# Patient Record
Sex: Female | Born: 2000
Health system: Southern US, Community
[De-identification: ages and names within clinical notes are randomized; demographics above are authoritative.]

## PROBLEM LIST (undated history)

## (undated) DIAGNOSIS — E669 Obesity, unspecified: Principal | ICD-10-CM

## (undated) HISTORY — DX: Obesity, unspecified: E66.9

## (undated) HISTORY — PX: WISDOM TOOTH EXTRACTION: SHX21

---
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
## 2018-01-27 ENCOUNTER — Encounter: Payer: Self-pay | Admitting: Family Medicine

## 2018-01-27 ENCOUNTER — Ambulatory Visit (INDEPENDENT_AMBULATORY_CARE_PROVIDER_SITE_OTHER): Payer: BLUE CROSS/BLUE SHIELD | Admitting: Family Medicine

## 2018-01-27 VITALS — BP 125/82 | HR 81 | Temp 98.4°F | Ht 64.0 in | Wt 173.0 lb

## 2018-01-27 DIAGNOSIS — Z23 Encounter for immunization: Secondary | ICD-10-CM | POA: Diagnosis not present

## 2018-01-27 DIAGNOSIS — Z862 Personal history of diseases of the blood and blood-forming organs and certain disorders involving the immune mechanism: Secondary | ICD-10-CM | POA: Diagnosis not present

## 2018-01-27 DIAGNOSIS — Z723 Lack of physical exercise: Secondary | ICD-10-CM

## 2018-01-27 DIAGNOSIS — G4719 Other hypersomnia: Secondary | ICD-10-CM

## 2018-01-27 NOTE — Patient Instructions (Addendum)
We will be obtaining a CBC, TSH, free T3 and free T4 as well as vitamin D, CMP, A1c and a full anemia panel with your history of iron deficiency anemia.  -We also have referred you to sleep medicine and if you have not heard from them in approximately 1 week, please call and speak with Joselyn Glassmanyler.  I would like to see you in about 1 month to see how you are doing with things.  However, we will call you sooner to review the lab results with you.  If you have my chart that is how we will communicate with you.   Hypersomnia Hypersomnia is when you feel extremely tired during the day even though you're getting plenty of sleep at night. You may need to take naps during the day, and you may also be extremely difficult to wake up when you are sleeping. What are the causes? The cause of your hypersomnia may not be known. Hypersomnia may be caused by:  Medicines.  Sleep disorders, such as narcolepsy.  Trauma or injury to your head or nervous system.  Using drugs or alcohol.  Tumors.  Medical conditions, such as depression or hypothyroidism.  Genetics.  What are the signs or symptoms? The main symptoms of hypersomnia include:  Feeling extremely tired throughout the day.  Being very difficult to wake up.  Sleeping for longer and longer periods.  Taking naps throughout the day.  Other symptoms may include:  Feeling: ? Restless. ? Annoyed. ? Anxious. ? Low energy.  Having difficulty: ? Remembering. ? Speaking. ? Thinking.  Losing your appetite.  Experiencing hallucinations.  How is this diagnosed? Hypersomnia may be diagnosed by:  Medical history and physical exam. This will include a sleep history.  Completing sleep logs.  Tests may also be done, such as: ? Polysomnography. ? Multiple sleep latency test (MSLT).  How is this treated? There is no cure for hypersomnia, but treatment can be very effective in helping manage the condition. Treatment may  include:  Lifestyle and sleeping strategies to help cope with the condition.  Stimulant medicines.  Treating any underlying causes of hypersomnia.  Follow these instructions at home:  Take medicines only as directed by your health care provider.  Schedule short naps for when you feel sleepiest during the day. Tell your employer or teachers that you have hypersomnia. You may be able to adjust your schedule to include time for naps.  Avoid drinking alcohol or caffeinated beverages.  Do not eat a heavy meal before bedtime. Eat at about the same times every day.  Do not drive or operate heavy machinery if you are sleepy.  Do not swim or go out on the water without a life jacket.  If possible, adjust your schedule so that you do not have to work or be active at night.  Keep all follow-up visits as directed by your health care provider. This is important. Contact a health care provider if:  You have new symptoms.  Your symptoms get worse. Get help right away if: You have serious thoughts of hurting yourself or someone else. This information is not intended to replace advice given to you by your health care provider. Make sure you discuss any questions you have with your health care provider. Document Released: 02/08/2002 Document Revised: 07/27/2015 Document Reviewed: 09/23/2013 Elsevier Interactive Patient Education  [employer]2018 [employer]

## 2018-01-27 NOTE — Progress Notes (Signed)
Impression and Recommendations:    1. Excessive daytime sleepiness   2. Hx of iron deficiency anemia   3. Inactivity   4. Need for influenza vaccination     1. Excessive Daytime Sleepiness - Referral to sleep study sent today. - Her total score of Epworth Sleepiness Scale is a 16 which is very high risk for OSA. -After discussion with patient, she prefers to go and speak with sleep medicine physician to determine next best steps and what her options are. (I.e. home sleep study etc.) - Lab work drawn today to rule out anemia, hypothyroidism, vitamin D deficiency, electrolyte imbalance etc. as a possible etiology for her fatigue and excessive daytime somnolence. -Notified patient we will contact her via my chart with these results. -   2. History of Iron Deficiency Anemia - No use of supplements since 6 months at least since upon repeat labs from [facility] they were normal and she was told she could discontinue them - Per pt, reported limited symptomatic improvement while on iron.   3. BMI Counseling Explained to patient what BMI refers to, and what it means medically.    Told patient to think about it as a "medical risk stratification measurement" and how increasing BMI is associated with increasing risk/ or worsening state of various diseases such as hypertension, hyperlipidemia, diabetes, premature OA, depression etc.  American Heart Association guidelines for healthy diet, basically Mediterranean diet, and exercise guidelines of 30 minutes 5 days per week or more discussed in detail.  Health counseling performed.  All questions answered.  4. Lifestyle & Preventative Health Maintenance - Advised patient to continue working toward exercising to improve overall mental, physical, and emotional health.    - Reviewed the "spokes of the wheel" of mood and health management.  Stressed the importance of ongoing prudent habits, including regular exercise, appropriate sleep  hygiene, healthful dietary habits, and prayer/meditation to relax.  - Encouraged patient to engage in daily physical activity, especially a formal exercise routine.  Recommended that the patient eventually strive for at least 150 minutes of moderate cardiovascular activity per week according to guidelines established by the [facility].   - Healthy dietary habits encouraged, including low-carb, and high amounts of lean protein in diet.   - Patient should also consume adequate amounts of water.  5. Follow-Up - Need for lab work today. - Discussed that we need to rule out anemia or thyroid, or sleep apnea. - Re-check fasting lab work as recommended. - Otherwise, continue to return for CPE and chronic follow-up as scheduled.   - Patient knows to call in sooner if desired to address acute concerns.    Orders Placed This Encounter  Procedures  . Flu Vaccine QUAD 6+ mos PF IM (Fluarix Quad PF)  . CBC with Differential/Platelet  . TSH  . T3, free  . T4, free  . VITAMIN D 25 Hydroxy (Vit-D Deficiency, Fractures)  . Comprehensive metabolic panel  . Hemoglobin A1c  . Iron, TIBC and Ferritin Panel  . Ambulatory referral to Sleep Studies     Medications Discontinued During This Encounter  Medication Reason  . Cholecalciferol (VITAMIN D3) 2000 units capsule Completed Course  . fexofenadine-pseudoephedrine (ALLEGRA-D ALLERGY & CONGESTION) 180-240 MG 24 hr tablet   . fluticasone (FLONASE) 50 MCG/ACT nasal spray   . Multiple Vitamin (MULTIVITAMIN) tablet   . phentermine (ADIPEX-P) 37.5 MG tablet      Gross side effects, risk and benefits, and alternatives of medications and  treatment plan in general discussed with patient.  Patient is aware that all medications have potential side effects and we are unable to predict every side effect or drug-drug interaction that may occur.   Patient will call with any questions prior to using medication if they have concerns.    Expresses verbal understanding  and consents to current therapy and treatment regimen.  No barriers to understanding were identified.  Red flag symptoms and signs discussed in detail.  Patient expressed understanding regarding what to do in case of emergency\urgent symptoms  Please see AVS handed out to patient at the end of our visit for further patient instructions/ counseling done pertaining to today's office visit.   Return for 4 to 5 weeks for follow-up on hypersomnolence..     Note:  This note was prepared with assistance of Dragon voice recognition software. Occasional wrong-word or sound-a-like substitutions may have occurred due to the inherent limitations of voice recognition software.   This document serves as a record of services personally performed by Thomasene Loteborah Aaronjames Kelsay, DO. It was created on her behalf by Peggye FothergillKatherine Galloway, a trained medical scribe. The creation of this record is based on the scribe's personal observations and the provider's statements to them.   I have reviewed the above medical documentation for accuracy and completeness and I concur.  Thomasene Loteborah Nieshia Larmon, DO 01/27/2018 6:49 PM       --------------------------------------------------------------------------------------------    Subjective:     HPI: Alm BustardHeather Kendle is a 17 y.o. female who [facility] today for issues as discussed below.  Notes she's been fine, just going to school.  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     Patient is not currently sexually active-no plans to become in the near future   Sleep Habits States that she's "tired, all the time."  She gets up at 6:30 AM for football practice.  Is in bed by 10:30 PM at night, and usually falls sleep quickly thereafter, "11 PM at the latest."  In high school, she was a bit more of an insomniac, but was functioning off  of about 7 hours of sleep at high school.  Reports that she fell asleep in class sometimes.  Last year, her roommate started going to bed super early, so the patient started sleeping early as well.  Now she feels "if I don't sleep in until 9:30 AM, I'm gonna fall asleep in class."  Denies cold intolerance, denies feeling freezing cold.  Denies losing her hair.  Does not feel like her skin is really pale or dry.  Falls asleep in class, sometimes while watching a movie, sometimes she'll get tired while driving for long distances.  "I don't feel like it's just random."  Applying to nursing school right now; under "normal stress for a college student."  The new fatigue and drowsiness "happened a little last year."  They drew labs last year through Student Health.  She had to take an iron supplement back then, and notes that after this supplementation, "she was fine."  Had her labs re-checked in about August.  Notes "didn't notice a difference on supplements."  She feels she doesn't eat terrible.  "I have a meal plan, so I usually eat a meat and a vegetable."  Her roommate has said that she snores sometimes.  Denies heart  palpitations, skipped beats, coughing, SOB.    Wt Readings from Last 3 Encounters:  01/27/18 173 lb (78.5 kg) (93 %, Z= 1.45)*  04/09/16 159 lb 1.6 oz (72.2 kg) (89 %, Z= 1.24)*  03/11/16 162 lb (73.5 kg) (91 %, Z= 1.32)*   * Growth percentiles are based on CDC (Girls, 2-20 Years) data.   BP Readings from Last 3 Encounters:  01/27/18 125/82  03/11/16 125/85  02/15/16 128/81 (95 %, Z = 1.61 /  95 %, Z = 1.60)*   *BP percentiles are based on the August 2017 AAP Clinical Practice Guideline for girls   Pulse Readings from Last 3 Encounters:  01/27/18 81  03/11/16 96  02/15/16 96   BMI Readings from Last 3 Encounters:  01/27/18 29.70 kg/m (93 %, Z= 1.46)*  04/09/16 27.10 kg/m (90 %, Z= 1.26)*  03/11/16 27.59 kg/m (91 %, Z= 1.33)*   * Growth percentiles are based  on CDC (Girls, 2-20 Years) data.     Patient Care Team    Relationship Specialty Notifications Start End  Thomasene Lot, DO PCP - General Family Medicine  08/22/15      Patient Active Problem List   Diagnosis Date Noted  . Stress headaches 11/24/2015    Priority: High  . Overweight (BMI 25.0-29.9) 10/26/2015    Priority: High  . Dysmenorrhea(painful periods) 08/30/2015    Priority: Medium  . Menorrhagia with regular cycle 08/30/2015    Priority: Medium  . mild sx of Fatigue 08/30/2015    Priority: Medium  . Environmental and seasonal allergies 11/24/2015    Priority: Low  . Excessive daytime sleepiness 01/27/2018  . Hx of iron deficiency anemia 01/27/2018  . Inactivity 10/05/2015  . Health education/counseling 09/23/2015    Past Medical history, Surgical history, Family history, Social history, Allergies and Medications have been entered into the medical record, reviewed and changed as needed.    Current Meds  Medication Sig  . MIBELAS 24 FE 1-20 MG-MCG(24) CHEW Chew 1 tablet by mouth daily.    Allergies:  No Known Allergies   Review of Systems:  A fourteen system review of systems was performed and found to be positive as per HPI.   Objective:   Blood pressure 125/82, pulse 81, temperature 98.4 F (36.9 C), height 5\' 4"  (1.626 m), weight 173 lb (78.5 kg), SpO2 100 %. Body mass index is 29.7 kg/m. General:  Well Developed, well nourished, appropriate for stated age.  Neuro:  Alert and oriented,  extra-ocular muscles intact  HEENT:  Normocephalic, atraumatic, neck supple, no carotid bruits appreciated  Skin:  no gross rash, warm, pink. Cardiac:  RRR, S1 S2 Respiratory:  ECTA B/L and A/P, Not using accessory muscles, speaking in full sentences- unlabored. Vascular:  Ext warm, no cyanosis apprec.; cap RF less 2 sec. Psych:  No HI/SI, judgement and insight good, Euthymic mood. Full Affect.

## 2018-01-28 LAB — CBC WITH DIFFERENTIAL/PLATELET
BASOS: 0 %
Basophils Absolute: 0 10*3/uL (ref 0.0–0.2)
EOS (ABSOLUTE): 0.1 10*3/uL (ref 0.0–0.4)
Eos: 1 %
Hematocrit: 40.5 % (ref 34.0–46.6)
Hemoglobin: 13.7 g/dL (ref 11.1–15.9)
IMMATURE GRANS (ABS): 0 10*3/uL (ref 0.0–0.1)
IMMATURE GRANULOCYTES: 0 %
LYMPHS: 25 %
Lymphocytes Absolute: 1.9 10*3/uL (ref 0.7–3.1)
MCH: 29 pg (ref 26.6–33.0)
MCHC: 33.8 g/dL (ref 31.5–35.7)
MCV: 86 fL (ref 79–97)
MONOS ABS: 0.5 10*3/uL (ref 0.1–0.9)
Monocytes: 7 %
NEUTROS PCT: 67 %
Neutrophils Absolute: 5.1 10*3/uL (ref 1.4–7.0)
PLATELETS: 280 10*3/uL (ref 150–450)
RBC: 4.72 x10E6/uL (ref 3.77–5.28)
RDW: 12.3 % (ref 12.3–15.4)
WBC: 7.7 10*3/uL (ref 3.4–10.8)

## 2018-01-28 LAB — COMPREHENSIVE METABOLIC PANEL
A/G RATIO: 1.4 (ref 1.2–2.2)
ALK PHOS: 79 IU/L (ref 39–117)
ALT: 14 IU/L (ref 0–32)
AST: 15 IU/L (ref 0–40)
Albumin: 4.2 g/dL (ref 3.5–5.5)
BILIRUBIN TOTAL: 0.3 mg/dL (ref 0.0–1.2)
BUN/Creatinine Ratio: 14 (ref 9–23)
BUN: 10 mg/dL (ref 6–20)
CALCIUM: 9 mg/dL (ref 8.7–10.2)
CHLORIDE: 102 mmol/L (ref 96–106)
CO2: 20 mmol/L (ref 20–29)
Creatinine, Ser: 0.73 mg/dL (ref 0.57–1.00)
GFR calc Af Amer: 138 mL/min/{1.73_m2} (ref >59)
GFR calc non Af Amer: 120 mL/min/{1.73_m2} (ref >59)
Globulin, Total: 2.9 g/dL (ref 1.5–4.5)
Glucose: 127 mg/dL — ABNORMAL HIGH (ref 65–99)
POTASSIUM: 3.9 mmol/L (ref 3.5–5.2)
Sodium: 138 mmol/L (ref 134–144)
Total Protein: 7.1 g/dL (ref 6.0–8.5)

## 2018-01-28 LAB — T4, FREE: FREE T4: 1.21 ng/dL (ref 0.93–1.60)

## 2018-01-28 LAB — IRON,TIBC AND FERRITIN PANEL
Ferritin: 68 ng/mL (ref 15–77)
Iron Saturation: 27 % (ref 15–55)
Iron: 77 ug/dL (ref 27–159)
TIBC: 283 ug/dL (ref 250–450)
UIBC: 206 ug/dL (ref 131–425)

## 2018-01-28 LAB — SPECIMEN STATUS REPORT

## 2018-01-28 LAB — VITAMIN D 25 HYDROXY (VIT D DEFICIENCY, FRACTURES): Vit D, 25-Hydroxy: 32 ng/mL (ref 30.0–100.0)

## 2018-01-28 LAB — T3, FREE: T3 FREE: 3.1 pg/mL (ref 2.3–5.0)

## 2018-01-28 LAB — HEMOGLOBIN A1C
Est. average glucose Bld gHb Est-mCnc: 105 mg/dL
HEMOGLOBIN A1C: 5.3 % (ref 4.8–5.6)

## 2018-01-28 LAB — TSH: TSH: 1.83 u[IU]/mL (ref 0.450–4.500)

## 2018-02-09 ENCOUNTER — Encounter: Payer: Self-pay | Admitting: Cardiovascular Disease

## 2018-02-09 ENCOUNTER — Ambulatory Visit: Payer: BLUE CROSS/BLUE SHIELD | Admitting: Cardiovascular Disease

## 2018-02-09 VITALS — BP 120/84 | HR 75 | Ht 64.0 in | Wt 171.6 lb

## 2018-02-09 DIAGNOSIS — Z862 Personal history of diseases of the blood and blood-forming organs and certain disorders involving the immune mechanism: Secondary | ICD-10-CM | POA: Diagnosis not present

## 2018-02-09 DIAGNOSIS — G4719 Other hypersomnia: Secondary | ICD-10-CM | POA: Diagnosis not present

## 2018-02-09 DIAGNOSIS — E663 Overweight: Secondary | ICD-10-CM

## 2018-02-09 NOTE — Patient Instructions (Signed)
Testing/Procedures: Your physician has recommended that you have a sleep study. This test records several body functions during sleep, including: brain activity, eye movement, oxygen and carbon dioxide blood levels, heart rate and rhythm, breathing rate and rhythm, the flow of air through your mouth and nose, snoring, body muscle movements, and chest and belly movement.  Follow-Up: At [facility], you and your health needs are our priority.  As part of our continuing mission to provide you with exceptional heart care, we have created designated Provider Care Teams.  These Care Teams include your primary Cardiologist (physician) and Advanced Practice Providers (APPs -  Physician Assistants and Nurse Practitioners) who all work together to provide you with the care you need, when you need it. . Dr. Tresa EndoKelly in 3 months (sleep clinic)

## 2018-02-09 NOTE — Progress Notes (Signed)
Cardiology Office Note    Date:  02/09/2018   ID:  Saide Lanuza, DOB Aug 09, 2000, MRN [id number]  PCP:  Mellody Dance, DO  Cardiologist:  Shelva Majestic, MD   Chief Complaint  Patient presents with  . New Patient (Initial Visit)    pt c/o tiredness all the time   New sleep consultation referred through the courtesy of Dr. Raliegh Scarlet  History of Present Illness:  Felicia Bennett is a 17 y.o. female who is a sophomore at [employer].  She is referred through the courtesy of Dr. Raliegh Scarlet for evaluation of daytime sleepiness and possible sleep apnea.  Felicia Bennett is a very pleasant 17 year old female who attended [employer].  She is at the end of her fall semester of her sophomore year.  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  When she was in high school, she was active.  She typically would sleep well but not be tired during the day.  Last year, she started to notice more fatigability and tiredness during the day and it actually underwent evaluation at student health where she was found to have low iron and was treated with short-term iron supplementation.  Over this past semester, she has noticed that she has become significantly more tired.  She is sleeping at least 7 to 8 hours per night and typically tries to sleep at least 8 hours per night.  In the football season, she would have to get up at 6:30 AM and will typically try to get to bed by 10 PM.  Without having to be there for football other day she could often sleep until 9 or 10:00 in the morning and feel better.  However, despite her recent sleeping 8 hours per night, she admits that she can fall asleep anytime during the day.  High school she was functioning on 7 hours of sleep per night and felt well.  She is unaware of any snoring.  She denies frequent awakenings.  She believes her she is sleeping well but when she wakes up she is still tired.  She denies any  nocturia.  She is unaware of any nocturnal palpitations.  At times while in [employer] she would have to put her head down and take a quick nap before trying to finish a paper.  She is under the normal stress for school curriculum this semester is taking 12 class credits and 3 credits online.  She denies any caffeine use.  She drinks water.  She denies any palpitations. She is unaware of any bruxism, restless legs, hypnogognic hallucinations, or episodes of cataplexy. She admits that she dreams when she sleeps.  She was recently evaluated by Dr. Mellody Dance and underwent complete comprehensive laboratory. Iron studies were normal.  She was not anemic with a hemoglobin of 13.7 and hematocrit of 40.5.  TSH was 1.83.  Free T3 3.1.  Free T4 was 1.21.  Vitamin D level was 32 and it was recommended that she supplement 2000 units of over-the-counter vitamin D which she has not yet instituted.  She is in the midst of final exams.  She will be going to the [facility] game with the team from December 22 through December 17 with able play in the [employer] at [employer].  She will be turning back to school on January 8 for the spring semester.   An Epworth Sleepiness Scale score was calculated in the office  today and this endorsed at 21 as shown below:  Epworth Sleepiness Scale: Situation   Chance of Dozing/Sleeping (0 = never , 1 = slight chance , 2 = moderate chance , 3 = high chance )   sitting and reading 3   watching TV 2   sitting inactive in a public place 2   being a passenger in a motor vehicle for an hour or more 3   lying down in the afternoon 3   sitting and talking to someone 0   sitting quietly after lunch (no alcohol) 2   while stopped for a few minutes in traffic as the driver 1   Total Score  16    She is referred by Dr. Raliegh Scarlet for further evaluation.   Past Medical History:  Diagnosis Date  . Obese 08/30/2015    Past Surgical History:  Procedure Laterality  Date  . WISDOM TOOTH EXTRACTION      Current Medications: Outpatient Medications Prior to Visit  Medication Sig Dispense Refill  . MIBELAS 24 FE 1-20 MG-MCG(24) CHEW Chew 1 tablet by mouth daily.  10   No facility-administered medications prior to visit.      Allergies:   Patient has no known allergies.   Social History   Socioeconomic History  . Marital status: Single    Spouse name: Not on file  . Number of children: Not on file  . Years of education: Not on file  . Highest education level: Not on file  Occupational History  . Not on file  Social Needs  . Financial resource strain: Not on file  . Food insecurity:    Worry: Not on file    Inability: Not on file  . Transportation needs:    Medical: Not on file    Non-medical: Not on file  Tobacco Use  . Smoking status: Never Smoker  . Smokeless tobacco: Never Used  Substance and Sexual Activity  . Alcohol use: No  . Drug use: No  . Sexual activity: Never  Lifestyle  . Physical activity:    Days per week: Not on file    Minutes per session: Not on file  . Stress: Not on file  Relationships  . Social connections:    Talks on phone: Not on file    Gets together: Not on file    Attends religious service: Not on file    Active member of club or organization: Not on file    Attends meetings of clubs or organizations: Not on file    Relationship status: Not on file  Other Topics Concern  . Not on file  Social History Narrative  . Not on file     Family History:  The patient's family history includes ADD / ADHD in her sister; Asthma in her paternal grandmother; Cancer in her paternal grandfather; Cerebral palsy in her brother; Diabetes in her mother and paternal grandmother; Heart attack in her maternal grandfather; Hyperlipidemia in her mother and paternal grandmother; Hypertension in her father, mother, and paternal grandmother; Stroke in her paternal grandmother; Thyroid disease in her maternal grandmother.    ROS General: Negative; No fevers, chills, or night sweats;  HEENT: Negative; No changes in vision or hearing, sinus congestion, difficulty swallowing Pulmonary: Negative; No cough, wheezing, shortness of breath, hemoptysis Cardiovascular: Negative; No chest pain, presyncope, syncope, palpitations GI: Negative; No nausea, vomiting, diarrhea, or abdominal pain GU: Negative; No dysuria, hematuria, or difficulty voiding Musculoskeletal: Negative; no myalgias, joint pain, or weakness Hematologic/Oncology: Negative;  no easy bruising, bleeding Endocrine: Negative; no heat/cold intolerance; no diabetes Neuro: Negative; no changes in balance, headaches Skin: Negative; No rashes or skin lesions Psychiatric: Negative; No behavioral problems, depression Sleep: See HPI  Other comprehensive 14 point system review is negative.   PHYSICAL EXAM:   VS:  BP 120/84   Pulse 75   Ht '5\' 4"'  (1.626 m)   Wt 171 lb 9.6 oz (77.8 kg)   BMI 29.46 kg/m     Pete blood pressure by me was 112/62 supine and 120/64 standing  Wt Readings from Last 3 Encounters:  02/09/18 171 lb 9.6 oz (77.8 kg) (92 %, Z= 1.41)*  01/27/18 173 lb (78.5 kg) (93 %, Z= 1.45)*  04/09/16 159 lb 1.6 oz (72.2 kg) (89 %, Z= 1.24)*   * Growth percentiles are based on CDC (Girls, 2-20 Years) data.    General: Alert, oriented, no distress.  Skin: normal turgor, no rashes, warm and dry HEENT: Normocephalic, atraumatic. Pupils equal round and reactive to light; sclera anicteric; extraocular muscles intact; Fundi disks flat.  No hemorrhages or exudates Nose without nasal septal hypertrophy Mouth/Parynx benign; Mallinpatti scale 3 Neck: No JVD, no carotid bruits; normal carotid upstroke Lungs: clear to ausculatation and percussion; no wheezing or rales Chest wall: without tenderness to palpitation Heart: PMI not displaced, RRR, s1 s2 normal, 1/6 systolic murmur, no diastolic murmur, no rubs, gallops, thrills, or heaves Abdomen: soft,  nontender; no hepatosplenomehaly, BS+; abdominal aorta nontender and not dilated by palpation. Back: no CVA tenderness Pulses 2+ Musculoskeletal: full range of motion, normal strength, no joint deformities Extremities: no clubbing cyanosis or edema, Homan's sign negative  Neurologic: grossly nonfocal; Cranial nerves grossly wnl Psychologic: Normal mood and affect   Studies/Labs Reviewed:   EKG:  EKG is ordered today.  ECG (independently read by me): Normal sinus rhythm at 75 bpm.  Normal intervals.  No ectopy. Recent Labs: BMP Latest Ref Rng & Units 01/27/2018 09/06/2015  Glucose 65 - 99 mg/dL 127(H) 93  BUN 6 - 20 mg/dL 10 13  Creatinine 0.57 - 1.00 mg/dL 0.73 0.76  BUN/Creat Ratio 9 - 23 14 -  Sodium 134 - 144 mmol/L 138 138  Potassium 3.5 - 5.2 mmol/L 3.9 5.2(H)  Chloride 96 - 106 mmol/L 102 104  CO2 20 - 29 mmol/L 20 23  Calcium 8.7 - 10.2 mg/dL 9.0 9.4     Hepatic Function Latest Ref Rng & Units 01/27/2018 09/06/2015  Total Protein 6.0 - 8.5 g/dL 7.1 7.7  Albumin 3.5 - 5.5 g/dL 4.2 4.1  AST 0 - 40 IU/L 15 14  ALT 0 - 32 IU/L 14 13  Alk Phosphatase 39 - 117 IU/L 79 68  Total Bilirubin 0.0 - 1.2 mg/dL 0.3 0.4    CBC Latest Ref Rng & Units 01/27/2018 09/06/2015  WBC 3.4 - 10.8 x10E3/uL 7.7 7.4  Hemoglobin 11.1 - 15.9 g/dL 13.7 14.3  Hematocrit 34.0 - 46.6 % 40.5 43.2  Platelets 150 - 450 x10E3/uL 280 274   Lab Results  Component Value Date   MCV 86 01/27/2018   MCV 87.8 09/06/2015   Lab Results  Component Value Date   TSH 1.830 01/27/2018   Lab Results  Component Value Date   HGBA1C 5.3 01/27/2018     BNP No results found for: BNP  ProBNP No results found for: PROBNP   Lipid Panel     Component Value Date/Time   CHOL 189 (H) 09/06/2015 1013   TRIG 110 09/06/2015 1013  HDL 66 09/06/2015 1013   CHOLHDL 2.9 09/06/2015 1013   VLDL 22 09/06/2015 1013   LDLCALC 101 09/06/2015 1013     RADIOLOGY: No results found.   Additional studies/ records that  were reviewed today include:   Ireiewed the medical records of Dr. Mellody Dance; calculated a new Epworth Sleepiness Scale score in the office.   ASSESSMENT:    1. Excessive daytime sleepiness   2. History of iron deficiency anemia: resolved   3. 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  Over the past year she admits that she has this progressive daytime sleepiness.  She believes she is sleeping more now in college than she had in high school but admits to significant progressive daytime sleepiness with a sensation that she could fall asleep almost any time during the day if given the opportunity.  She has been sleeping at least 7 to 8 hours per night but feels better when she sleeps 10 hours per night.  She is mildly overweight with a BMI of 29.46.  Her blood pressure is stable.  Recent laboratory evaluation did not disclose any significant abnormalities contributing to her sleepiness.  She is not on any medications.  She does not use caffeine.  There is no alcohol use.  Upon questioning it does not appear that she has significant alerting symptoms of obstructive sleep apnea but this will need to be excluded.  At times she has been told by roommate there may be some mild snoring but she is not significantly aware of loud snoring.  She denies any awareness of awakening gasping for breath.  Recently despite sleeping 8 hours per night she is continuously sleepy during the day and her Epworth Sleepiness Scale score endorses this at 16.  I am recommending that she undergo an initial evaluation for sleep apnea and if this is negative I have recommended a multiple latency sleep test to assess for idiopathic hypersomnolence or narcolepsy in the etiology of her excessive daytime sleepiness.  She is in all exams at school and will be with the [employer] football team for their ballgame at the end of  December.  I will try to arrange that her sleep study can be done prior to her ball trip and if negative will then try to arrange an MST test prior to her return to school if the schedule will allow.  Medication Adjustments/Labs and Tests Ordered: Current medicines are reviewed at length with the patient today.  Concerns regarding medicines are outlined above.  Medication changes, Labs and Tests ordered today are listed in the Patient Instructions below. Patient Instructions  Testing/Procedures: Your physician has recommended that you have a sleep study. This test records several body functions during sleep, including: brain activity, eye movement, oxygen and carbon dioxide blood levels, heart rate and rhythm, breathing rate and rhythm, the flow of air through your mouth and nose, snoring, body muscle movements, and chest and belly movement.  Follow-Up: At [facility], you and your health needs are our priority.  As part of our continuing mission to provide you with exceptional heart care, we have created designated Provider Care Teams.  These Care Teams include your primary Cardiologist (physician) and Advanced Practice Providers (APPs -  Physician Assistants and Nurse Practitioners) who all work together to provide you with the care you need, when you need it. . Dr. Claiborne Billings in  3 months (sleep clinic)       Signed, Shelva Majestic, MD  02/09/2018 [facility] [address], [place], [place], [place]  [address] Phone: [phone]

## 2018-02-10 ENCOUNTER — Telehealth: Payer: Self-pay | Admitting: *Deleted

## 2018-02-10 NOTE — Telephone Encounter (Signed)
Left message for patient to return a call to get HST appt details.

## 2018-02-10 NOTE — Telephone Encounter (Signed)
-----   Message from Silver Huguenineedie C Creed sent at 02/09/2018  4:58 PM EST ----- Regarding: In Home Sleep Study Please precert and schedule.  Remember, she's the one that needs 3 months follow up.

## 2018-02-11 NOTE — Telephone Encounter (Signed)
Per Dorothea OgleKia Boone @ the sleep lab when they called to confirm appointment patient reschedule HST to 02/20/18.

## 2018-02-13 ENCOUNTER — Encounter (HOSPITAL_BASED_OUTPATIENT_CLINIC_OR_DEPARTMENT_OTHER): Payer: BLUE CROSS/BLUE SHIELD

## 2018-02-17 DIAGNOSIS — H5201 Hypermetropia, right eye: Secondary | ICD-10-CM | POA: Diagnosis not present

## 2018-02-20 ENCOUNTER — Encounter (HOSPITAL_BASED_OUTPATIENT_CLINIC_OR_DEPARTMENT_OTHER): Payer: BLUE CROSS/BLUE SHIELD

## 2018-03-09 ENCOUNTER — Ambulatory Visit (HOSPITAL_BASED_OUTPATIENT_CLINIC_OR_DEPARTMENT_OTHER): Payer: BLUE CROSS/BLUE SHIELD | Attending: Cardiovascular Disease | Admitting: Cardiovascular Disease

## 2018-03-09 DIAGNOSIS — G4719 Other hypersomnia: Secondary | ICD-10-CM | POA: Diagnosis not present

## 2018-03-18 ENCOUNTER — Encounter (HOSPITAL_BASED_OUTPATIENT_CLINIC_OR_DEPARTMENT_OTHER): Payer: Self-pay | Admitting: Cardiovascular Disease

## 2018-03-18 NOTE — Procedures (Signed)
    Patient Name: Felicia Bennett, Felicia Bennett Date: 03/10/2018 Gender: Female D.O.B: December 01, 2000 Age (years): 18 Referring Provider: Nicki Guadalajara MD, ABSM Height (inches): 64 Interpreting Physician: Nicki Guadalajara MD, ABSM Weight (lbs): 170 RPSGT: Lena Sink BMI: 29 MRN: [id number] Neck Size: 12.50  CLINICAL INFORMATION Sleep Study Type: HST  Indication for sleep study: Excessive Daytime Sleepiness  Epworth Sleepiness Score: 12;   ESS documented at 16 at 02/09/2018  office visit.  SLEEP STUDY TECHNIQUE A multi-channel overnight portable sleep study was performed. The channels recorded were: nasal airflow, thoracic respiratory movement, and oxygen saturation with a pulse oximetry. Snoring was also monitored.  MEDICATIONS   MIBELAS 24 FE 1-20 MG-MCG(24) CHEW  Patient self administered medications include: N/A.  SLEEP ARCHITECTURE Patient was studied for 404 minutes. The sleep efficiency was 98.8 % and the patient was supine for 81%. The arousal index was 0.0 per hour.  RESPIRATORY PARAMETERS The overall AHI was 0.0 per hour, with a central apnea index of 0.0 per hour.  The oxygen nadir was 92% during sleep.  CARDIAC DATA Mean heart rate during sleep was 71.9 bpm.  IMPRESSIONS - No obstructive sleep apnea occurred during this study (AHI 0.0/h). - No significant central sleep apnea occurred during this study (CAI = 0.0/h). - The patient had minimal or no oxygen desaturation during the study (Min O2 = 92%) - Patient snored 1.5% during the sleep.  DIAGNOSIS - Normal study  RECOMMENDATIONS - There is no indication for sleep apnea.  - In this patient with a significant history of excessive daytime sleepiness consider a multiple sleep latency test (MLST) to assess for idiopathic hypersomnolence or narcolepsy - Avoid alcohol, sedatives and other CNS depressants that may worsen sleep apnea and disrupt normal sleep architecture. - Sleep hygiene should be reviewed to assess  factors that may improve sleep quality. - Weight management and regular exercise should be initiated or continued. - Return to Sleep Center to discuss the results of this study   [Electronically signed] 03/18/2018 06:38 PM  Nicki Guadalajara MD, [facility], ABSM Diplomate, American Board of Sleep Medicine   NPI: [id number]  [facility] PH: [phone]   FX: [phone] ACCREDITED BY THE AMERICAN ACADEMY OF SLEEP MEDICINE

## 2018-03-20 ENCOUNTER — Telehealth: Payer: Self-pay | Admitting: *Deleted

## 2018-03-20 NOTE — Progress Notes (Signed)
Patient notified of sleep study results and recommendations. She agrees to proceed with MLST.

## 2018-03-20 NOTE — Telephone Encounter (Signed)
Left message to return a call to discuss sleep study resuts and recommendations.

## 2018-03-20 NOTE — Telephone Encounter (Signed)
-----   Message from Lennette Bihari, MD sent at 03/18/2018  6:43 PM EST ----- Felicia Bennett, normal home study.  In this pt with significant h/o excessive daytime sleepiness recommend a MSLT study to assess for idiopathic hypersomnolence or narcolepsy.

## 2018-03-24 ENCOUNTER — Telehealth: Payer: Self-pay | Admitting: *Deleted

## 2018-03-24 ENCOUNTER — Other Ambulatory Visit: Payer: Self-pay | Admitting: Cardiovascular Disease

## 2018-03-24 DIAGNOSIS — G4719 Other hypersomnia: Secondary | ICD-10-CM

## 2018-03-24 NOTE — Telephone Encounter (Signed)
Called patient to give her the appointment for the MLST study. She had requested for this to be done on a Friday night. Per Kia these are only done Monday thru Wednesday. Patient notified that she can go on a Sunday night and complete the NSPG on Monday. She is declining the test for now. She states that she cannot miss school. I told her that her [employer] Authorization is good until 05/22/18. She states that she will call me back when she is ready to proceed.  She states that she also cannot do the test while on spring break. Dr Tresa Endo will be notified.

## 2018-04-02 NOTE — Telephone Encounter (Signed)
Acknowledged.

## 2018-08-31 DIAGNOSIS — Z6830 Body mass index (BMI) 30.0-30.9, adult: Secondary | ICD-10-CM | POA: Diagnosis not present

## 2018-08-31 DIAGNOSIS — Z01419 Encounter for gynecological examination (general) (routine) without abnormal findings: Secondary | ICD-10-CM | POA: Diagnosis not present

## 2018-09-22 ENCOUNTER — Telehealth: Payer: Self-pay | Admitting: Family Medicine

## 2018-09-22 NOTE — Telephone Encounter (Signed)
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  --- Form placed in med asst's tray  --glh

## 2018-09-23 NOTE — Telephone Encounter (Signed)
Patient needs OV. MPulliam, CMA/RT(R)

## 2018-10-01 ENCOUNTER — Other Ambulatory Visit: Payer: Self-pay

## 2018-10-01 DIAGNOSIS — G9349 Other encephalopathy: Secondary | ICD-10-CM

## 2018-10-01 DIAGNOSIS — U071 COVID-19: Secondary | ICD-10-CM

## 2018-10-01 NOTE — Progress Notes (Signed)
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 Raliegh Scarlet recommends COVID testing. MPulliam, CMA/RT(R)

## 2018-10-02 ENCOUNTER — Other Ambulatory Visit: Payer: Self-pay

## 2018-10-02 DIAGNOSIS — R6889 Other general symptoms and signs: Secondary | ICD-10-CM | POA: Diagnosis not present

## 2018-10-02 DIAGNOSIS — Z20822 Contact with and (suspected) exposure to covid-19: Secondary | ICD-10-CM

## 2018-10-04 LAB — NOVEL CORONAVIRUS, NAA: SARS-CoV-2, NAA: NOT DETECTED

## 2019-09-01 NOTE — Progress Notes (Signed)
Encounter to establish care and Complete Physical   Assessment and Plan:  Felicia Bennett was seen today for establish care.  Diagnoses and all orders for this visit:  Encounter to establish care with new doctor  Encounter for routine physical exam with abnormal findings Health Maintenance- Discussed STD testing, safe sex, alcohol and drug awareness, drinking and driving dangers, wearing a seat belt and general safety measures for young adult.  Dysmenorrhea(painful periods)/ Menorrhagia with regular cycle GYN following, improved on HBC  Stress headaches Stress management techniques discussed, increase water, get up and stretch, neck ROM Continue OTC analgesics which work well, try moist heat to neck and stretching  Obesity - BMI 30 Long discussion about weight loss, diet, and exercise Recommended diet heavy in fruits and veggies and low in animal meats, cheeses, and dairy products, appropriate calorie intake Patient will work on increasing fruits/veggies intake, more consistent exercise, portions Discussed appropriate weight for height and initial goal (<160 lb) Follow up at next visit  Hx of iron deficiency anemia Improved on HBC Excessive daytime sleepiness Per patient improved; declining furhter workup good sleep hygiene discussed, increase day time activity, try melatonin or benadryl if this does not help we will call in sleep medication.   Need for prophylactic vaccination with tetanus-diphtheria (Td) -     Td : Tetanus/diphtheria >7yo Preservative  free  Elevated BP without diagnosis of hypertension Improved on recheck; monitor and encouraged some lifestyle changes DASH diet, exercise, weight loss, stress management, limit ETOH and caffeine  Rash  Most suggestive of Ringworm of body Try ketoconazole BID x 1-2 weeks; follow up if not improving or resolving Hygiene reviewed  -     ketoconazole (NIZORAL) 2 % cream; Apply 1 application topically 2 (two) times daily.  Discussed  med's effects and SE's. Screening labs and tests as requested with regular follow-up as recommended. Over 40 minutes of exam, counseling, chart review and critical decision making was performed  No future appointments.   HPI  This very nice 19 y.o.female presents to establish care and for complete physical. She has Obesity (BMI 30.0-34.9); Dysmenorrhea(painful periods); Menorrhagia with regular cycle; mild sx of Fatigue; Inactivity; Environmental and seasonal allergies; Stress headaches; Excessive daytime sleepiness; Hx of iron deficiency anemia; and Elevated BP without diagnosis of hypertension on their problem list.  She was seeing Dr. Raliegh Scarlet, left the practice and wanted to move here as whole family comes to our practice. She would like to discuss weight, also rash to torso and R axilla.   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    Dysmenorrhea/manorrhagia improved via [place] by GYN with improvement. Never sexually active. Had HPV vaccines. Will have first PAP by GYN later this year - [facility].   She has stress headaches, ibuprofen works, pressure in temples, typically at the end of the day, denies vision changes, photosensitivity, nausea.   Saw Dr. Claiborne Billings in Jan 2020 for excessive daytime somnolence, underwent sleep study which showed no sleep apnea, advised MLST for idiopathic hypersomnolence or narcolepsy but never completed, sx not severe, not planning to pursue.   BMI is Body mass index is 30.18 kg/m., she has been working on diet and exercise. Has lost weight in the past with lifestyle changes, knows what to do, but has regained. Harder at home with mom cooking.  Tries to get 7+ hours of sleep  Estimates 3-4 servings of veggies daily NO alcohol  Caffeine- minimal  Water - 20 oz x 2 + 3 glasses  Wt Readings from Last 3 Encounters:  09/03/19 175 lb 12.8 oz (79.7 kg)  02/09/18 171 lb 9.6 oz (77.8  kg) (92 %, Z= 1.41)*  01/27/18 173 lb (78.5 kg) (93 %, Z= 1.45)*   * Growth percentiles are based on CDC (Girls, 2-20 Years) data.   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 She denies chest pain, shortness of breath, dizziness.  The cholesterol last visit was:   Lab Results  Component Value Date   CHOL 189 (H) 09/06/2015   HDL 66 09/06/2015   LDLCALC 101 09/06/2015   TRIG 110 09/06/2015   CHOLHDL 2.9 09/06/2015    Last A1C in the office was:  Lab Results  Component Value Date   HGBA1C 5.3 01/27/2018   Finally, patient has history of Vitamin D Deficiency and last vitamin D was  Lab Results  Component Value Date   VD25OH 32.0 01/27/2018  .  Currently on supplementation, taking 2000 IU   Hx of iron def anemia with menorrhagia; none since starting HBC;  Lab Results  Component Value Date   WBC 7.7 01/27/2018   HGB 13.7 01/27/2018   HCT 40.5 01/27/2018   MCV 86 01/27/2018   PLT 280 01/27/2018     Current Medications:  Current Outpatient Medications on File Prior to Visit  Medication Sig Dispense Refill  . Cholecalciferol (VITAMIN D) 50 MCG (2000 UT) CAPS Take by mouth daily.    Marland Kitchen MIBELAS 24 FE 1-20 MG-MCG(24) CHEW Chew 1 tablet by mouth daily.  10   No current facility-administered medications on file prior to visit.   Health Maintenance:   Immunization History  Administered Date(s) Administered  . DTaP 05/11/1998, 07/10/1998, 09/13/1998, 06/20/1999, 06/24/2003  . HPV Quadrivalent 10/09/2011, 02/19/2012, 10/19/2012  . Hepatitis A 07/25/2008, 07/03/2009  . Hepatitis B 05/11/1998, 07/10/1998, 12/13/1998  . HiB (PRP-OMP) 05/11/1998, 07/10/1998, 09/13/1998, 06/20/1999  . IPV 05/11/1998, 07/10/1998, 03/13/1999, 06/24/2003  . Influenza,inj,Quad PF,6+ Mos 12/12/2015, 01/27/2018  . MMR 03/13/1999, 06/24/2003  . Meningococcal B, OMV 09/14/2014, 10/19/2014  . Meningococcal Conjugate 07/03/2009  .  Meningococcal Polysaccharide 09/14/2014  . PFIZER SARS-COV-2 Vaccination 03/18/2019, 04/05/2019  . Pneumococcal Conjugate-13 07/10/1998, 09/13/1998, 12/13/1998, 06/20/1999  . Td 09/03/2019  . Tdap 07/03/2009  . Varicella 03/13/1999, 12/12/2015    TD/TDAP: 2011 Influenza: 12/2018 HPV vaccines: 3/3 as a teen Covid 19: 2/2, 2021, pfizer   LMP: No LMP recorded. Sexually Active: NO  Pap: will start with GYN - [facility]  MGM:   Vision: [place] [facility], Dr. Wynetta Emery, reading glasses, ? Astigmatism  Dental: Dr. Lavella Hammock, goes q32m  Allergies: No Known Allergies Medical History:  has Obesity (BMI 30.0-34.9); Dysmenorrhea(painful periods); Menorrhagia with regular cycle; mild sx of Fatigue; Inactivity; Environmental and seasonal allergies; Stress headaches; Excessive daytime sleepiness; Hx of iron deficiency anemia; and Elevated BP without diagnosis of hypertension on their problem list. Surgical History:  She  has a past surgical history that includes Wisdom tooth extraction. Family History:  Her family history includes ADD / ADHD in her sister; Asthma in her paternal grandmother; Cancer in her paternal grandfather; Cerebral palsy in her brother; Diabetes in her mother and paternal grandmother; Heart attack in her maternal grandfather; Hyperlipidemia in her mother and paternal grandmother; Hypertension in her father, mother, and paternal grandmother; Stroke in her paternal grandmother; Thyroid disease  in her maternal grandmother. Social History:   reports that she has never smoked. She has never used smokeless tobacco. She reports that she does not drink alcohol and does not use drugs.  Review of Systems: Review of Systems  Constitutional: Positive for malaise/fatigue. Negative for weight loss.  HENT: Negative for hearing loss and tinnitus.   Eyes: Negative for blurred vision and double vision.  Respiratory: Negative for cough, shortness of breath and wheezing.    Cardiovascular: Negative for chest pain, palpitations, orthopnea, claudication and leg swelling.  Gastrointestinal: Negative for abdominal pain, blood in stool, constipation, diarrhea, heartburn, melena, nausea and vomiting.  Genitourinary: Negative.   Musculoskeletal: Negative for joint pain and myalgias.  Skin: Positive for rash. Negative for itching.  Neurological: Negative for dizziness, tingling, sensory change, weakness and headaches.  Endo/Heme/Allergies: Negative for polydipsia.  Psychiatric/Behavioral: Negative.   All other systems reviewed and are negative.   Physical Exam: Estimated body mass index is 30.18 kg/m as calculated from the following:   Height as of this encounter: '5\' 4"'  (1.626 m).   Weight as of this encounter: 175 lb 12.8 oz (79.7 kg). BP 124/78   Pulse 97   Temp (!) 97.3 F (36.3 C)   Ht '5\' 4"'  (1.626 m)   Wt 175 lb 12.8 oz (79.7 kg)   SpO2 99%   BMI 30.18 kg/m  General Appearance: Well nourished, in no apparent distress.  Eyes: PERRLA, EOMs, conjunctiva no swelling or erythema Sinuses: No Frontal/maxillary tenderness  ENT/Mouth: Ext aud canals clear, normal light reflex with TMs without erythema, bulging. Good dentition. No erythema, swelling, or exudate on post pharynx. Tonsils not swollen or erythematous. Hearing normal.  Neck: Supple, thyroid normal. No bruits  Respiratory: Respiratory effort normal, BS equal bilaterally without rales, rhonchi, wheezing or stridor.  Cardio: RRR without murmurs, rubs or gallops. Brisk peripheral pulses without edema.  Chest: symmetric, with normal excursions and percussion.  Breasts: Defer Abdomen: Soft, nontender, no guarding, rebound, hernias, masses, or organomegaly.  Lymphatics: Non tender without lymphadenopathy.  Genitourinary: Defer Musculoskeletal: Full ROM all peripheral extremities,5/5 strength, and normal gait.  Skin: Warm, dry without, lesions, ecchymosis. She has 5 round areas approx 8~10 mm to anterior  torso with erythematous rash with subtle raised texture, ? Central clearing, no discharge, annular area to R axilla approx 3 cm Neuro: Cranial nerves intact, reflexes equal bilaterally. Normal muscle tone, no cerebellar symptoms. Sensation intact.  Psych: Awake and oriented X 3, normal affect, Insight and Judgment appropriate.   Gorden Harms Felicia Bennett 12:56 PM [facility]

## 2019-09-03 ENCOUNTER — Encounter: Payer: Self-pay | Admitting: Adult Health

## 2019-09-03 ENCOUNTER — Ambulatory Visit (INDEPENDENT_AMBULATORY_CARE_PROVIDER_SITE_OTHER): Payer: BC Managed Care – PPO | Admitting: Adult Health

## 2019-09-03 ENCOUNTER — Other Ambulatory Visit: Payer: Self-pay

## 2019-09-03 VITALS — BP 124/78 | HR 97 | Temp 97.3°F | Ht 64.0 in | Wt 175.8 lb

## 2019-09-03 DIAGNOSIS — Z0000 Encounter for general adult medical examination without abnormal findings: Secondary | ICD-10-CM | POA: Diagnosis not present

## 2019-09-03 DIAGNOSIS — Z862 Personal history of diseases of the blood and blood-forming organs and certain disorders involving the immune mechanism: Secondary | ICD-10-CM

## 2019-09-03 DIAGNOSIS — Z7689 Persons encountering health services in other specified circumstances: Secondary | ICD-10-CM

## 2019-09-03 DIAGNOSIS — G4719 Other hypersomnia: Secondary | ICD-10-CM

## 2019-09-03 DIAGNOSIS — Z23 Encounter for immunization: Secondary | ICD-10-CM | POA: Diagnosis not present

## 2019-09-03 DIAGNOSIS — R030 Elevated blood-pressure reading, without diagnosis of hypertension: Secondary | ICD-10-CM

## 2019-09-03 DIAGNOSIS — E663 Overweight: Secondary | ICD-10-CM

## 2019-09-03 DIAGNOSIS — F4541 Pain disorder exclusively related to psychological factors: Secondary | ICD-10-CM

## 2019-09-03 DIAGNOSIS — Z0001 Encounter for general adult medical examination with abnormal findings: Secondary | ICD-10-CM

## 2019-09-03 DIAGNOSIS — N920 Excessive and frequent menstruation with regular cycle: Secondary | ICD-10-CM

## 2019-09-03 DIAGNOSIS — N946 Dysmenorrhea, unspecified: Secondary | ICD-10-CM

## 2019-09-03 DIAGNOSIS — B354 Tinea corporis: Secondary | ICD-10-CM

## 2019-09-03 MED ORDER — KETOCONAZOLE 2 % EX CREA
1.0000 | TOPICAL_CREAM | Freq: Two times a day (BID) | CUTANEOUS | 0 refills | Status: DC
Start: 2019-09-03 — End: 2019-09-16

## 2019-09-03 NOTE — Patient Instructions (Addendum)
Ms. Schouten , Thank you for taking time to come for your Annual Wellness Visit. I appreciate your ongoing commitment to your health goals. Please review the following plan we discussed and let me know if I can assist you in the future.   These are the goals we discussed: Goals     Blood Pressure < 120/80     DIET - EAT MORE FRUITS AND VEGETABLES     Aim for 7-10+ 1/2 cup servings of fibrous fruits and veggies daily      Weight (lb) < 160 lb (72.6 kg)       This is a list of the screening recommended for you and due dates:  Health Maintenance  Topic Date Due   Pap Smear  Never done   Pap Smear  Never done   HIV Screening  10/03/2019*   Flu Shot  10/03/2019   Tetanus Vaccine  09/02/2029   COVID-19 Vaccine  Completed    Hepatitis C: One time screening is recommended by Center for Disease Control  (CDC) for  adults born from [birth year] through 1967.   Discontinued  *Topic was postponed. The date shown is not the original due date.     Know what a healthy weight is for you (roughly BMI <25) and aim to maintain this  Aim for 7+ servings of high fiber fruits and vegetables daily   65-80+ fluid ounces of water or unsweet tea for healthy kidneys  Limit to max 1 drink of alcohol per day; avoid smoking/tobacco  Limit animal fats in diet for cholesterol and heart health - choose grass fed whenever available  Avoid highly processed foods (fast and junk foods, white flour, sugar, high fructose corn syrup), and foods high in saturated/trans fats  Aim for low stress - take time to unwind and care for your mental health  Aim for 150 min of moderate intensity exercise weekly for heart health, and weights twice weekly for bone health  Aim for 7-9 hours of sleep daily   General weight loss tips  Drink 1/2 your body weight in fluid ounces of water daily; drink a tall glass of water 30 min before meals  Don't eat until you're stuffed- listen to your stomach and eat until you are 80%  full   Try eating off of a salad plate; wait 10 min after finishing before going back for seconds  Start by eating the vegetables on your plate; aim for 50% of your meals to be fruits or vegetables  Then eat your protein - lean meats (grass fed if possible), fish, beans, nuts in moderation  Eat your carbs/starch last ONLY if you still are hungry. If you can, stop before finishing it all  Avoid sugar and flour - the closer it looks to it's original form in nature, typically the better it is for you  Splurge in moderation - "assign" days when you get to splurge and have the "bad stuff" - I like to follow a 80% - 20% plan- "good" choices 80 % of the time, "bad" choices in moderation 20% of the time  Simple equation is: Calories out > calories in = weight loss - even if you eat the bad stuff, if you limit portions, you will still lose weight Weigh once a week in the morning and keep a log    A great goal to work towards is aiming to get in a serving daily of some of the most nutritionally dense foods - G- BOMBS daily

## 2019-09-16 ENCOUNTER — Other Ambulatory Visit: Payer: Self-pay | Admitting: Adult Health

## 2019-09-16 MED ORDER — TRIAMCINOLONE ACETONIDE 0.5 % EX OINT
1.0000 | TOPICAL_OINTMENT | Freq: Two times a day (BID) | CUTANEOUS | 0 refills | Status: DC
Start: 2019-09-16 — End: 2020-11-01

## 2019-09-22 ENCOUNTER — Ambulatory Visit: Payer: Self-pay | Admitting: Adult Health

## 2020-09-06 ENCOUNTER — Encounter: Payer: BC Managed Care – PPO | Admitting: Adult Health Nurse Practitioner

## 2020-10-16 ENCOUNTER — Encounter: Payer: BC Managed Care – PPO | Admitting: Adult Health

## 2020-10-31 NOTE — Progress Notes (Signed)
Complete Physical  Assessment and Plan: Health Maintenance- Discussed STD testing, safe sex, alcohol and drug awareness, drinking and driving dangers, wearing a seat belt and general safety measures for young adult.  Felicia Bennett was seen today for annual exam.  Diagnoses and all orders for Felicia visit:  Encounter for routine adult health examination without abnormal findings  Due yearly  Screening, lipid -     Lipid panel  Screening for diabetes mellitus -     Hemoglobin A1c  Screening for thyroid disorder -     TSH  Screening, anemia, deficiency, iron -     CBC with Differential/Platelet -     Iron, Total/Total Iron Binding Cap -     Ferritin  Screening for hematuria or proteinuria -     Urinalysis, Routine w reflex microscopic -     Microalbumin / creatinine urine ratio  Medication management -     CBC with Differential/Platelet -     COMPLETE METABOLIC PANEL WITH GFR -     Lipid panel -     TSH -     Hemoglobin A1c -     Urinalysis, Routine w reflex microscopic -     Microalbumin / creatinine urine ratio -     VITAMIN D 25 Hydroxy (Vit-D Deficiency, Fractures) -     Magnesium -     Iron, Total/Total Iron Binding Cap -     Ferritin  Vitamin D deficiency -     VITAMIN D 25 Hydroxy (Vit-D Deficiency, Fractures) - May reinstate Vit D supplementation pending results  Obesity      - Long discussion on diet and exercise.  Has started walking at night with her dad and is eating healthier since graduating in May and moving back home with her parents  Fatigue        - Will check CBC and TSH        - Monitor symptoms, wear iwatch at night for several nights to determine sleep patterns, if worsens will refer to neuro for sleep study at sleep center.  Discussed med's effects and SE's. Screening labs and tests as requested with regular follow-up as recommended. Over 40 minutes of exam, counseling, chart review and critical decision making was performed  HPI  Felicia Bennett  20 y.o.female presents for complete physical.  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 Is applying to PA school, graduated from [employer] in exercise and sports science.  She does workout. Walks 2 miles nightly with her father BMI is Body mass index is 30.07 kg/m., she has been working on diet and exercise. Wt Readings from Last 3 Encounters:  11/01/20 175 lb 3.2 oz (79.5 kg)  09/03/19 175 lb 12.8 oz (79.7 kg)  02/09/18 171 lb 9.6 oz (77.8 kg) (92 %, Z= 1.41)*   * Growth percentiles are based on CDC (Girls, 2-20 Years) data.   Did an at home sleep study which was normal.  Pt can fall asleep very easily- feels like she can flal asleep while driving and could fall asleep in class.  Finally, patient has history of Vitamin D Deficiency and last vitamin D was  Lab Results  Component Value Date   VD25OH 32.0 01/27/2018  .  Currently on supplementation  Current Medications:  Current Outpatient Medications on File Prior to Visit  Medication Sig Dispense Refill   MIBELAS 24 FE 1-20 MG-MCG(24) CHEW Chew 1 tablet by mouth daily.  10   Cholecalciferol (VITAMIN D) 50 MCG (  2000 UT) CAPS Take by mouth daily. (Patient not taking: Reported on 11/01/2020)     triamcinolone ointment (KENALOG) 0.5 % Apply 1 application topically 2 (two) times daily. 30 g 0   No current facility-administered medications on file prior to visit.   Health Maintenance:   Immunization History  Administered Date(s) Administered   DTaP 05/11/1998, 07/10/1998, 09/13/1998, 06/20/1999, 06/24/2003   HPV Quadrivalent 10/09/2011, 02/19/2012, 10/19/2012   Hepatitis A 07/25/2008, 07/03/2009   Hepatitis B 05/11/1998, 07/10/1998, 12/13/1998   HiB (PRP-OMP) 05/11/1998, 07/10/1998, 09/13/1998, 06/20/1999   IPV 05/11/1998, 07/10/1998, 03/13/1999, 06/24/2003   Influenza,inj,Quad PF,6+ Mos 12/12/2015, 01/27/2018   MMR 03/13/1999, 06/24/2003   Meningococcal B, OMV 09/14/2014, 10/19/2014   Meningococcal Conjugate 07/03/2009    Meningococcal Polysaccharide 09/14/2014   PFIZER(Purple Top)SARS-COV-2 Vaccination 03/18/2019, 04/05/2019   Pneumococcal Conjugate-13 07/10/1998, 09/13/1998, 12/13/1998, 06/20/1999   Td 09/03/2019   Tdap 07/03/2009   Varicella 03/13/1999, 12/12/2015    TD/TDAP:09/2019 Influenza: 01/2020 Pneumovax:  Prevnar 13:  HPV vaccines: set of 3 completed 2014  LMP: Patient's last menstrual period was 08/26/2020. Sexually Active: no STD testing offered Pap: Appt 11/13/20 MGM: : N/A  Allergies: No Known Allergies Medical History:  has Obesity (BMI 30.0-34.9); Dysmenorrhea(painful periods); Menorrhagia with regular cycle; mild sx of Fatigue; Inactivity; Stress headaches; Excessive daytime sleepiness; Hx of iron deficiency anemia; and Elevated BP without diagnosis of hypertension on their problem list. Surgical History:  She  has a past surgical history that includes Wisdom tooth extraction. Family History:  Her family history includes ADD / ADHD in her sister; Asthma in her paternal grandmother; Cancer in her paternal grandfather; Cerebral palsy in her brother; Diabetes in her mother and paternal grandmother; Heart attack in her maternal grandfather; Hyperlipidemia in her mother and paternal grandmother; Hypertension in her father, mother, and paternal grandmother; Stroke in her paternal grandmother; Thyroid disease in her maternal grandmother. Social History:   reports that she has never smoked. She has never used smokeless tobacco. She reports that she does not drink alcohol and does not use drugs.  Review of Systems: Review of Systems  Constitutional:  Negative for chills and fever.  HENT:  Negative for congestion, hearing loss, sinus pain, sore throat and tinnitus.   Eyes:  Negative for blurred vision and double vision.  Respiratory:  Negative for cough, hemoptysis, sputum production, shortness of breath and wheezing.   Cardiovascular:  Negative for chest pain, palpitations and leg swelling.   Gastrointestinal:  Negative for abdominal pain, constipation, diarrhea, heartburn, nausea and vomiting.  Genitourinary:  Negative for dysuria and urgency.  Musculoskeletal:  Negative for back pain, falls, joint pain, myalgias and neck pain.  Skin:  Negative for rash.  Neurological:  Negative for dizziness, tingling, tremors, weakness and headaches.  Endo/Heme/Allergies:  Does not bruise/bleed easily.  Psychiatric/Behavioral:  Negative for depression and suicidal ideas. The patient is not nervous/anxious and does not have insomnia.        Feels sleepy all the time, wakes up feeling tired   Physical Exam: Estimated body mass index is 30.07 kg/m as calculated from the following:   Height as of Felicia encounter: _0  (1.626 m).   Weight as of Felicia encounter: 175 lb 3.2 oz (79.5 kg). BP 120/90   Pulse (!) 101   Temp 97.7 F (36.5 C)   Ht _1  (1.626 m)   Wt 175 lb 3.2 oz (79.5 kg)   LMP 08/26/2020   SpO2 98%   BMI 30.07 kg/m  General Appearance: Well nourished, in  no apparent distress.  Eyes: PERRLA, EOMs, conjunctiva no swelling or erythema, normal fundi and vessels.  Sinuses: No Frontal/maxillary tenderness  ENT/Mouth: Ext aud canals clear, normal light reflex with TMs without erythema, bulging. Good dentition. No erythema, swelling, or exudate on post pharynx. Tonsils not swollen or erythematous. Hearing normal.  Neck: Supple, thyroid normal. No bruits  Respiratory: Respiratory effort normal, BS equal bilaterally without rales, rhonchi, wheezing or stridor.  Cardio: RRR without murmurs, rubs or gallops. Brisk peripheral pulses without edema.  Chest: symmetric, with normal excursions and percussion.  Breasts: defer to GYN Abdomen: Soft, nontender, no guarding, rebound, hernias, masses, or organomegaly.  Lymphatics: Non tender without lymphadenopathy.  Genitourinary: defer to GYN Musculoskeletal: Full ROM all peripheral extremities,5/5 strength, and normal gait.  Skin: Warm, dry  without rashes, lesions, ecchymosis. Neuro: Cranial nerves intact, reflexes equal bilaterally. Normal muscle tone, no cerebellar symptoms. Sensation intact.  Psych: Awake and oriented X 3, normal affect, Insight and Judgment appropriate.   EKG: defer  [place] [facility] Debany Vantol 2:21 PM [facility]

## 2020-11-01 ENCOUNTER — Ambulatory Visit (INDEPENDENT_AMBULATORY_CARE_PROVIDER_SITE_OTHER): Payer: BC Managed Care – PPO | Admitting: Nurse Practitioner

## 2020-11-01 ENCOUNTER — Other Ambulatory Visit: Payer: Self-pay

## 2020-11-01 ENCOUNTER — Encounter: Payer: Self-pay | Admitting: Nurse Practitioner

## 2020-11-01 VITALS — BP 120/90 | HR 101 | Temp 97.7°F | Ht 64.0 in | Wt 175.2 lb

## 2020-11-01 DIAGNOSIS — Z13220 Encounter for screening for lipoid disorders: Secondary | ICD-10-CM | POA: Diagnosis not present

## 2020-11-01 DIAGNOSIS — E669 Obesity, unspecified: Secondary | ICD-10-CM

## 2020-11-01 DIAGNOSIS — E559 Vitamin D deficiency, unspecified: Secondary | ICD-10-CM

## 2020-11-01 DIAGNOSIS — Z131 Encounter for screening for diabetes mellitus: Secondary | ICD-10-CM | POA: Diagnosis not present

## 2020-11-01 DIAGNOSIS — Z0000 Encounter for general adult medical examination without abnormal findings: Secondary | ICD-10-CM

## 2020-11-01 DIAGNOSIS — Z130 Encounter for screening for diseases of the blood and blood-forming organs and certain disorders involving the immune mechanism: Secondary | ICD-10-CM | POA: Diagnosis not present

## 2020-11-01 DIAGNOSIS — Z1389 Encounter for screening for other disorder: Secondary | ICD-10-CM | POA: Diagnosis not present

## 2020-11-01 DIAGNOSIS — Z79899 Other long term (current) drug therapy: Secondary | ICD-10-CM

## 2020-11-01 DIAGNOSIS — Z1329 Encounter for screening for other suspected endocrine disorder: Secondary | ICD-10-CM

## 2020-11-01 DIAGNOSIS — R5383 Other fatigue: Secondary | ICD-10-CM

## 2020-11-01 NOTE — Patient Instructions (Signed)

## 2020-11-02 LAB — CBC WITH DIFFERENTIAL/PLATELET
Absolute Monocytes: 515 cells/uL (ref 200–950)
Basophils Absolute: 8 cells/uL (ref 0–200)
Basophils Relative: 0.1 %
Eosinophils Absolute: 101 cells/uL (ref 15–500)
Eosinophils Relative: 1.3 %
HCT: 42.2 % (ref 35.0–45.0)
Hemoglobin: 14.2 g/dL (ref 11.7–15.5)
Lymphs Abs: 1794 cells/uL (ref 850–3900)
MCH: 28.9 pg (ref 27.0–33.0)
MCHC: 33.6 g/dL (ref 32.0–36.0)
MCV: 85.9 fL (ref 80.0–100.0)
MPV: 11.1 fL (ref 7.5–12.5)
Monocytes Relative: 6.6 %
Neutro Abs: 5382 cells/uL (ref 1500–7800)
Neutrophils Relative %: 69 %
Platelets: 295 10*3/uL (ref 140–400)
RBC: 4.91 10*6/uL (ref 3.80–5.10)
RDW: 13.3 % (ref 11.0–15.0)
Total Lymphocyte: 23 %
WBC: 7.8 10*3/uL (ref 3.8–10.8)

## 2020-11-02 LAB — URINALYSIS, ROUTINE W REFLEX MICROSCOPIC
Bilirubin Urine: NEGATIVE
Glucose, UA: NEGATIVE
Hgb urine dipstick: NEGATIVE
Ketones, ur: NEGATIVE
Leukocytes,Ua: NEGATIVE
Nitrite: NEGATIVE
Protein, ur: NEGATIVE
Specific Gravity, Urine: 1.023 (ref 1.001–1.035)
pH: 7.5 (ref 5.0–8.0)

## 2020-11-02 LAB — COMPLETE METABOLIC PANEL WITH GFR
AG Ratio: 1.3 (calc) (ref 1.0–2.5)
ALT: 12 U/L (ref 6–29)
AST: 13 U/L (ref 10–30)
Albumin: 4.2 g/dL (ref 3.6–5.1)
Alkaline phosphatase (APISO): 80 U/L (ref 31–125)
BUN: 11 mg/dL (ref 7–25)
CO2: 24 mmol/L (ref 20–32)
Calcium: 9.1 mg/dL (ref 8.6–10.2)
Chloride: 105 mmol/L (ref 98–110)
Creat: 0.77 mg/dL (ref 0.50–0.96)
Globulin: 3.3 g/dL (calc) (ref 1.9–3.7)
Glucose, Bld: 94 mg/dL (ref 65–99)
Potassium: 4.2 mmol/L (ref 3.5–5.3)
Sodium: 138 mmol/L (ref 135–146)
Total Bilirubin: 0.4 mg/dL (ref 0.2–1.2)
Total Protein: 7.5 g/dL (ref 6.1–8.1)
eGFR: 112 mL/min/{1.73_m2} (ref >=60)

## 2020-11-02 LAB — IRON, TOTAL/TOTAL IRON BINDING CAP
%SAT: 19 % (calc) (ref 16–45)
Iron: 76 ug/dL (ref 40–190)
TIBC: 394 mcg/dL (calc) (ref 250–450)

## 2020-11-02 LAB — VITAMIN D 25 HYDROXY (VIT D DEFICIENCY, FRACTURES): Vit D, 25-Hydroxy: 32 ng/mL (ref 30–100)

## 2020-11-02 LAB — HEMOGLOBIN A1C
Hgb A1c MFr Bld: 5.2 % of total Hgb (ref <5.7)
Mean Plasma Glucose: 103 mg/dL
eAG (mmol/L): 5.7 mmol/L

## 2020-11-02 LAB — FERRITIN: Ferritin: 16 ng/mL (ref 16–154)

## 2020-11-02 LAB — LIPID PANEL
Cholesterol: 194 mg/dL (ref <200)
HDL: 66 mg/dL (ref >=50)
LDL Cholesterol (Calc): 107 mg/dL (calc) — ABNORMAL HIGH
Non-HDL Cholesterol (Calc): 128 mg/dL (calc) (ref <130)
Total CHOL/HDL Ratio: 2.9 (calc) (ref <5.0)
Triglycerides: 116 mg/dL (ref <150)

## 2020-11-02 LAB — MICROALBUMIN / CREATININE URINE RATIO
Creatinine, Urine: 146 mg/dL (ref 20–275)
Microalb Creat Ratio: 2 mcg/mg creat (ref <30)
Microalb, Ur: 0.3 mg/dL

## 2020-11-02 LAB — MAGNESIUM: Magnesium: 2.2 mg/dL (ref 1.5–2.5)

## 2020-11-02 LAB — TSH: TSH: 1.96 mIU/L

## 2020-11-22 ENCOUNTER — Other Ambulatory Visit: Payer: Self-pay | Admitting: Nurse Practitioner

## 2020-11-22 DIAGNOSIS — R5382 Chronic fatigue, unspecified: Secondary | ICD-10-CM

## 2021-10-08 ENCOUNTER — Encounter: Payer: Self-pay | Admitting: Nurse Practitioner

## 2021-10-08 ENCOUNTER — Ambulatory Visit (INDEPENDENT_AMBULATORY_CARE_PROVIDER_SITE_OTHER): Payer: Commercial Managed Care - PPO | Admitting: Nurse Practitioner

## 2021-10-08 VITALS — BP 116/86 | HR 96 | Temp 97.9°F | Ht 64.0 in | Wt 185.4 lb

## 2021-10-08 DIAGNOSIS — E669 Obesity, unspecified: Secondary | ICD-10-CM

## 2021-10-08 DIAGNOSIS — Z1329 Encounter for screening for other suspected endocrine disorder: Secondary | ICD-10-CM

## 2021-10-08 DIAGNOSIS — Z1389 Encounter for screening for other disorder: Secondary | ICD-10-CM

## 2021-10-08 DIAGNOSIS — Z0000 Encounter for general adult medical examination without abnormal findings: Secondary | ICD-10-CM | POA: Diagnosis not present

## 2021-10-08 DIAGNOSIS — E559 Vitamin D deficiency, unspecified: Secondary | ICD-10-CM

## 2021-10-08 DIAGNOSIS — Z79899 Other long term (current) drug therapy: Secondary | ICD-10-CM | POA: Diagnosis not present

## 2021-10-08 DIAGNOSIS — R5383 Other fatigue: Secondary | ICD-10-CM | POA: Diagnosis not present

## 2021-10-08 DIAGNOSIS — R7309 Other abnormal glucose: Secondary | ICD-10-CM

## 2021-10-08 DIAGNOSIS — K644 Residual hemorrhoidal skin tags: Secondary | ICD-10-CM

## 2021-10-08 DIAGNOSIS — Z131 Encounter for screening for diabetes mellitus: Secondary | ICD-10-CM

## 2021-10-08 DIAGNOSIS — Z130 Encounter for screening for diseases of the blood and blood-forming organs and certain disorders involving the immune mechanism: Secondary | ICD-10-CM

## 2021-10-08 DIAGNOSIS — E782 Mixed hyperlipidemia: Secondary | ICD-10-CM

## 2021-10-08 NOTE — Progress Notes (Signed)
Complete Physical  Assessment and Plan: Health Maintenance- Discussed STD testing, safe sex, alcohol and drug awareness, drinking and driving dangers, wearing a seat belt and general safety measures for young adult.  Felicia Bennett was seen today for annual exam.  Diagnoses and all orders for this visit:  Encounter for routine adult health examination without abnormal findings  Due yearly  Had GYN appt this AM  Hyperlipidemia Currently controlling without medication Continue diet and exercise -Lipid panel  Screening for diabetes mellitus -     Hemoglobin A1c  Screening for thyroid disorder -     TSH  Screening, anemia, deficiency, iron -     CBC with Differential/Platelet   Screening for hematuria or proteinuria -     Urinalysis, Routine w reflex microscopic -     Microalbumin / creatinine urine ratio  Medication management -     CBC with Differential/Platelet -     COMPLETE METABOLIC PANEL WITH GFR -     Lipid panel -     TSH -     Hemoglobin A1c -     Urinalysis, Routine w reflex microscopic -     VITAMIN D 25 Hydroxy (Vit-D Deficiency, Fractures) -     Magnesium   Vitamin D deficiency -     VITAMIN D 25 Hydroxy (Vit-D Deficiency, Fractures) - Currently on Vitamin D3 2000 units daily  Obesity Long discussion about weight loss, diet, and exercise Recommended diet heavy in fruits and veggies and low in animal meats, cheeses, and dairy products, appropriate calorie intake Patient will work on decreasing saturated fats, simple carbs and increase exercise Follow up at next visit   Skin tag perianal region Will have evaluated at gynecology for removal  Discussed med's effects and SE's. Screening labs and tests as requested with regular follow-up as recommended. Over 40 minutes of exam, counseling, chart review and critical decision making was performed Future Appointments  Date Time Provider [place]  10/09/2022  3:00 PM Alycia Rossetti, NP [facility] None      HPI  This very nice 21 y.o.female presents for complete physical.  . Is applying to PA school, graduated from [employer] in exercise and sports science. She is currently in PA school at [employer]  She does workout. 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  She has noticed a skin tag at rectum which has been present since she was a child. No bleeding ever, believes it is getting larger, sometimes is irritated.  She is not on cholesterol medication. She has not been watching diet or exercising Lab Results  Component Value Date   CHOL 194 11/01/2020   HDL 66 11/01/2020   LDLCALC 107 (H) 11/01/2020   TRIG 116 11/01/2020   CHOLHDL 2.9 11/01/2020     BMI is Body mass index is 31.82 kg/m., she has been working on diet and exercise. Wt Readings from Last 3 Encounters:  10/08/21 185 lb 6.4 oz (84.1 kg)  11/01/20 175 lb 3.2 oz (79.5 kg)  09/03/19 175 lb 12.8 oz (79.7 kg)   She is getting good sleep   Finally, patient has history of Vitamin D Deficiency , is currently on 2000 units Vit D3 daily and last vitamin D was  Lab Results  Component Value Date   VD25OH 32 11/01/2020    Current Medications:  Current Outpatient Medications on File Prior to Visit  Medication Sig Dispense Refill   Cholecalciferol (VITAMIN D3) 50  MCG (2000 UT) CAPS Take by mouth.     MIBELAS 24 FE 1-20 MG-MCG(24) CHEW Chew 1 tablet by mouth daily.  10   No current facility-administered medications on file prior to visit.   Health Maintenance:   Immunization History  Administered Date(s) Administered   DTaP 05/11/1998, 07/10/1998, 09/13/1998, 06/20/1999, 06/24/2003   HPV Quadrivalent 10/09/2011, 02/19/2012, 10/19/2012   Hepatitis A 07/25/2008, 07/03/2009   Hepatitis B 05/11/1998, 07/10/1998, 12/13/1998   HiB (PRP-OMP) 05/11/1998, 07/10/1998, 09/13/1998, 06/20/1999   IPV 05/11/1998, 07/10/1998, 03/13/1999, 06/24/2003   Influenza,inj,Quad PF,6+ Mos 12/12/2015,  01/27/2018   MMR 03/13/1999, 06/24/2003   Meningococcal B, OMV 09/14/2014, 10/19/2014   Meningococcal Conjugate 07/03/2009   Meningococcal Polysaccharide 09/14/2014   PFIZER(Purple Top)SARS-COV-2 Vaccination 03/18/2019, 04/05/2019   Pfizer Covid-19 Vaccine Bivalent Booster 69yrs & up 01/23/2021   Pneumococcal Conjugate-13 07/10/1998, 09/13/1998, 12/13/1998, 06/20/1999   Td 09/03/2019   Tdap 07/03/2009, 01/23/2021   Varicella 03/13/1999, 12/12/2015    TD/TDAP:09/2019 Influenza: 01/2020 Pneumovax:  Prevnar 13:  HPV vaccines: set of 3 completed 2014  LMP: Patient's last menstrual period was 09/15/2021. Sexually Active: no STD testing offered Pap: 10/2021 MGM: : N/A  Allergies: No Known Allergies Medical History:  has Obesity (BMI 30.0-34.9); Dysmenorrhea(painful periods); Menorrhagia with regular cycle; mild sx of Fatigue; Inactivity; Stress headaches; Excessive daytime sleepiness; Hx of iron deficiency anemia; Elevated BP without diagnosis of hypertension; Mixed hyperlipidemia; and Vitamin D deficiency on their problem list. Surgical History:  She  has a past surgical history that includes Wisdom tooth extraction. Family History:  Her family history includes ADD / ADHD in her sister; Asthma in her paternal grandmother; Cancer in her paternal grandfather; Cerebral palsy in her brother; Diabetes in her mother and paternal grandmother; Heart attack in her maternal grandfather; Hyperlipidemia in her mother and paternal grandmother; Hypertension in her father, mother, and paternal grandmother; Stroke in her paternal grandmother; Thyroid disease in her maternal grandmother. Social History:   reports that she has never smoked. She has never used smokeless tobacco. She reports that she does not drink alcohol and does not use drugs.  Review of Systems: Review of Systems  Constitutional:  Negative for chills and fever.  HENT:  Negative for congestion, hearing loss, sinus pain, sore throat and  tinnitus.   Eyes:  Negative for blurred vision and double vision.  Respiratory:  Negative for cough, hemoptysis, sputum production, shortness of breath and wheezing.   Cardiovascular:  Negative for chest pain, palpitations and leg swelling.  Gastrointestinal:  Negative for abdominal pain, constipation, diarrhea, heartburn, nausea and vomiting.  Genitourinary:  Negative for dysuria and urgency.  Musculoskeletal:  Negative for back pain, falls, joint pain, myalgias and neck pain.  Skin:  Negative for rash.       Skin tag at rectum  Neurological:  Negative for dizziness, tingling, tremors, weakness and headaches.  Endo/Heme/Allergies:  Does not bruise/bleed easily.  Psychiatric/Behavioral:  Negative for depression and suicidal ideas. The patient is not nervous/anxious and does not have insomnia.     Physical Exam: Estimated body mass index is 31.82 kg/m as calculated from the following:   Height as of this encounter: $RemoveBeforeD'5\' 4"'[id number]$  (1.626 m).   Weight as of this encounter: 185 lb 6.4 oz (84.1 kg). BP 116/86   Pulse 96   Temp 97.9 F (36.6 C)   Ht $R'5\' 4"'Ak$  (1.626 m)   Wt 185 lb 6.4 oz (84.1 kg)   LMP 09/15/2021   SpO2 98%   BMI 31.82 kg/m  General Appearance:  Well nourished, in no apparent distress.  Eyes: PERRLA, EOMs, conjunctiva no swelling or erythema, normal fundi and vessels.  Sinuses: No Frontal/maxillary tenderness  ENT/Mouth: Ext aud canals clear, normal light reflex with TMs without erythema, bulging. Good dentition. No erythema, swelling, or exudate on post pharynx. Tonsils not swollen or erythematous. Hearing normal.  Neck: Supple, thyroid normal. No bruits  Respiratory: Respiratory effort normal, BS equal bilaterally without rales, rhonchi, wheezing or stridor.  Cardio: RRR without murmurs, rubs or gallops. Brisk peripheral pulses without edema.  Chest: symmetric, with normal excursions and percussion.  Breasts: defer to GYN Abdomen: Soft, nontender, no guarding, rebound, hernias,  masses, or organomegaly.  Lymphatics: Non tender without lymphadenopathy.  Genitourinary: defer to GYN Musculoskeletal: Full ROM all peripheral extremities,5/5 strength, and normal gait.  Skin: Warm, dry without rashes, lesions, ecchymosis. Neuro: Cranial nerves intact, reflexes equal bilaterally. Normal muscle tone, no cerebellar symptoms. Sensation intact.  Psych: Awake and oriented X 3, normal affect, Insight and Judgment appropriate.  Rectal: < 1 cm skin tag noted on stalk  EKG: defer  Ileana Chalupa E  3:30 PM [facility]

## 2021-10-09 LAB — URINALYSIS, ROUTINE W REFLEX MICROSCOPIC
Bilirubin Urine: NEGATIVE
Glucose, UA: NEGATIVE
Hgb urine dipstick: NEGATIVE
Ketones, ur: NEGATIVE
Leukocytes,Ua: NEGATIVE
Nitrite: NEGATIVE
Protein, ur: NEGATIVE
Specific Gravity, Urine: 1.016 (ref 1.001–1.035)
pH: 6 (ref 5.0–8.0)

## 2021-10-09 LAB — CBC WITH DIFFERENTIAL/PLATELET
Absolute Monocytes: 616 cells/uL (ref 200–950)
Basophils Absolute: 23 cells/uL (ref 0–200)
Basophils Relative: 0.3 %
Eosinophils Absolute: 94 cells/uL (ref 15–500)
Eosinophils Relative: 1.2 %
HCT: 42 % (ref 35.0–45.0)
Hemoglobin: 14 g/dL (ref 11.7–15.5)
Lymphs Abs: 2114 cells/uL (ref 850–3900)
MCH: 28.5 pg (ref 27.0–33.0)
MCHC: 33.3 g/dL (ref 32.0–36.0)
MCV: 85.5 fL (ref 80.0–100.0)
MPV: 11.2 fL (ref 7.5–12.5)
Monocytes Relative: 7.9 %
Neutro Abs: 4953 cells/uL (ref 1500–7800)
Neutrophils Relative %: 63.5 %
Platelets: 292 10*3/uL (ref 140–400)
RBC: 4.91 10*6/uL (ref 3.80–5.10)
RDW: 12.8 % (ref 11.0–15.0)
Total Lymphocyte: 27.1 %
WBC: 7.8 10*3/uL (ref 3.8–10.8)

## 2021-10-09 LAB — HEMOGLOBIN A1C
Hgb A1c MFr Bld: 5.6 % of total Hgb (ref <5.7)
Mean Plasma Glucose: 114 mg/dL
eAG (mmol/L): 6.3 mmol/L

## 2021-10-09 LAB — COMPLETE METABOLIC PANEL WITH GFR
AG Ratio: 1.3 (calc) (ref 1.0–2.5)
ALT: 12 U/L (ref 6–29)
AST: 12 U/L (ref 10–30)
Albumin: 4.1 g/dL (ref 3.6–5.1)
Alkaline phosphatase (APISO): 72 U/L (ref 31–125)
BUN: 12 mg/dL (ref 7–25)
CO2: 24 mmol/L (ref 20–32)
Calcium: 9.2 mg/dL (ref 8.6–10.2)
Chloride: 106 mmol/L (ref 98–110)
Creat: 0.7 mg/dL (ref 0.50–0.96)
Globulin: 3.1 g/dL (calc) (ref 1.9–3.7)
Glucose, Bld: 83 mg/dL (ref 65–99)
Potassium: 4 mmol/L (ref 3.5–5.3)
Sodium: 139 mmol/L (ref 135–146)
Total Bilirubin: 0.2 mg/dL (ref 0.2–1.2)
Total Protein: 7.2 g/dL (ref 6.1–8.1)
eGFR: 125 mL/min/{1.73_m2} (ref >=60)

## 2021-10-09 LAB — MAGNESIUM: Magnesium: 2.1 mg/dL (ref 1.5–2.5)

## 2021-10-09 LAB — LIPID PANEL
Cholesterol: 189 mg/dL (ref <200)
HDL: 63 mg/dL (ref >=50)
LDL Cholesterol (Calc): 107 mg/dL (calc) — ABNORMAL HIGH
Non-HDL Cholesterol (Calc): 126 mg/dL (calc) (ref <130)
Total CHOL/HDL Ratio: 3 (calc) (ref <5.0)
Triglycerides: 96 mg/dL (ref <150)

## 2021-10-09 LAB — VITAMIN D 25 HYDROXY (VIT D DEFICIENCY, FRACTURES): Vit D, 25-Hydroxy: 51 ng/mL (ref 30–100)

## 2021-10-09 LAB — TSH: TSH: 1.81 mIU/L

## 2021-11-01 ENCOUNTER — Encounter: Payer: Commercial Managed Care - PPO | Admitting: Nurse Practitioner

## 2021-11-20 ENCOUNTER — Encounter: Payer: Self-pay | Admitting: Nurse Practitioner

## 2021-12-07 ENCOUNTER — Ambulatory Visit: Payer: Commercial Managed Care - PPO | Admitting: Nurse Practitioner

## 2022-06-06 ENCOUNTER — Telehealth: Payer: Self-pay

## 2022-06-06 ENCOUNTER — Encounter: Payer: Self-pay | Admitting: Nurse Practitioner

## 2022-06-06 NOTE — Telephone Encounter (Signed)
Patient is positive for Quantiferon Gold blood test, negative CXR- going to [facility] for treatment.

## 2022-08-05 ENCOUNTER — Encounter: Payer: Self-pay | Admitting: Internal Medicine

## 2022-10-09 ENCOUNTER — Encounter: Payer: Commercial Managed Care - PPO | Admitting: Nurse Practitioner

## 2022-10-09 NOTE — Progress Notes (Deleted)
Complete Physical  Assessment and Plan: Health Maintenance- Discussed STD testing, safe sex, alcohol and drug awareness, drinking and driving dangers, wearing a seat belt and general safety measures for young adult.  Karie was seen today for annual exam.  Diagnoses and all orders for this visit:  Encounter for routine adult health examination without abnormal findings  Due yearly  Had GYN appt this AM  Hyperlipidemia Currently controlling without medication Continue diet and exercise -Lipid panel  Screening for diabetes mellitus -     Hemoglobin A1c  Screening for thyroid disorder -     TSH  Screening, anemia, deficiency, iron -     CBC with Differential/Platelet   Screening for hematuria or proteinuria -     Urinalysis, Routine w reflex microscopic -     Microalbumin / creatinine urine ratio  Medication management -     CBC with Differential/Platelet -     COMPLETE METABOLIC PANEL WITH GFR -     Lipid panel -     TSH -     Hemoglobin A1c -     Urinalysis, Routine w reflex microscopic -     VITAMIN D 25 Hydroxy (Vit-D Deficiency, Fractures) -     Magnesium   Vitamin D deficiency -     VITAMIN D 25 Hydroxy (Vit-D Deficiency, Fractures) - Currently on Vitamin D3 2000 units daily  Obesity Long discussion about weight loss, diet, and exercise Recommended diet heavy in fruits and veggies and low in animal meats, cheeses, and dairy products, appropriate calorie intake Patient will work on decreasing saturated fats, simple carbs and increase exercise Follow up at next visit   Skin tag perianal region Will have evaluated at gynecology for removal  Discussed med's effects and SE's. Screening labs and tests as requested with regular follow-up as recommended. Over 40 minutes of exam, counseling, chart review and critical decision making was performed Future Appointments  Date Time Provider Department Center  10/10/2022  3:00 PM Raynelle Dick, NP [facility] None   10/15/2023  3:00 PM Raynelle Dick, NP [facility] None     HPI  This very nice 22 y.o.female presents for complete physical.  . Is applying to PA school, graduated from [employer] in exercise and sports science. She is currently in PA school at [employer]  She does workout. 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  She has noticed a skin tag at rectum which has been present since she was a child. No bleeding ever, believes it is getting larger, sometimes is irritated.  She is not on cholesterol medication. She has not been watching diet or exercising Lab Results  Component Value Date   CHOL 189 10/08/2021   HDL 63 10/08/2021   LDLCALC 107 (H) 10/08/2021   TRIG 96 10/08/2021   CHOLHDL 3.0 10/08/2021     BMI is There is no height or weight on file to calculate BMI., she has been working on diet and exercise. Wt Readings from Last 3 Encounters:  10/08/21 185 lb 6.4 oz (84.1 kg)  11/01/20 175 lb 3.2 oz (79.5 kg)  09/03/19 175 lb 12.8 oz (79.7 kg)   She is getting good sleep   Finally, patient has history of Vitamin D Deficiency , is currently on 2000 units Vit D3 daily and last vitamin D was  Lab Results  Component Value Date   VD25OH 51 10/08/2021    Current Medications:  Current Outpatient Medications  on File Prior to Visit  Medication Sig Dispense Refill   Cholecalciferol (VITAMIN D3) 50 MCG (2000 UT) CAPS Take by mouth.     MIBELAS 24 FE 1-20 MG-MCG(24) CHEW Chew 1 tablet by mouth daily.  10   No current facility-administered medications on file prior to visit.   Health Maintenance:   Immunization History  Administered Date(s) Administered   DTaP 05/11/1998, 07/10/1998, 09/13/1998, 06/20/1999, 06/24/2003   HIB (PRP-OMP) 05/11/1998, 07/10/1998, 09/13/1998, 06/20/1999   HPV Quadrivalent 10/09/2011, 02/19/2012, 10/19/2012   Hepatitis A 07/25/2008, 07/03/2009   Hepatitis B 05/11/1998, 07/10/1998, 12/13/1998   IPV  05/11/1998, 07/10/1998, 03/13/1999, 06/24/2003   Influenza,inj,Quad PF,6+ Mos 12/12/2015, 01/27/2018   Influenza-Unspecified 12/28/2019   MMR 03/13/1999, 06/24/2003   Meningococcal B, OMV 09/14/2014, 10/19/2014   Meningococcal Conjugate 07/03/2009   Meningococcal polysaccharide vaccine (MPSV4) 09/14/2014   PFIZER(Purple Top)SARS-COV-2 Vaccination 03/18/2019, 04/05/2019   Pfizer Covid-19 Vaccine Bivalent Booster 39yrs & up 01/23/2021   Pneumococcal Conjugate-13 07/10/1998, 09/13/1998, 12/13/1998, 06/20/1999   Td 09/03/2019   Tdap 07/03/2009, 01/23/2021   Varicella 03/13/1999, 12/12/2015    TD/TDAP:09/2019 Influenza: 01/2020 Pneumovax:  Prevnar 13:  HPV vaccines: set of 3 completed 2014  LMP: No LMP recorded. Sexually Active: no STD testing offered Pap: 10/2021 MGM: : N/A  Allergies: No Known Allergies Medical History:  has Obesity (BMI 30.0-34.9); Dysmenorrhea(painful periods); Menorrhagia with regular cycle; mild sx of Fatigue; Inactivity; Stress headaches; Excessive daytime sleepiness; Hx of iron deficiency anemia; Elevated BP without diagnosis of hypertension; Mixed hyperlipidemia; and Vitamin D deficiency on their problem list. Surgical History:  She  has a past surgical history that includes Wisdom tooth extraction. Family History:  Her family history includes ADD / ADHD in her sister; Asthma in her paternal grandmother; Cancer in her paternal grandfather; Cerebral palsy in her brother; Diabetes in her mother and paternal grandmother; Heart attack in her maternal grandfather; Hyperlipidemia in her mother and paternal grandmother; Hypertension in her father, mother, and paternal grandmother; Stroke in her paternal grandmother; Thyroid disease in her maternal grandmother. Social History:   reports that she has never smoked. She has never used smokeless tobacco. She reports that she does not drink alcohol and does not use drugs.  Review of Systems: Review of Systems   Constitutional:  Negative for chills and fever.  HENT:  Negative for congestion, hearing loss, sinus pain, sore throat and tinnitus.   Eyes:  Negative for blurred vision and double vision.  Respiratory:  Negative for cough, hemoptysis, sputum production, shortness of breath and wheezing.   Cardiovascular:  Negative for chest pain, palpitations and leg swelling.  Gastrointestinal:  Negative for abdominal pain, constipation, diarrhea, heartburn, nausea and vomiting.  Genitourinary:  Negative for dysuria and urgency.  Musculoskeletal:  Negative for back pain, falls, joint pain, myalgias and neck pain.  Skin:  Negative for rash.       Skin tag at rectum  Neurological:  Negative for dizziness, tingling, tremors, weakness and headaches.  Endo/Heme/Allergies:  Does not bruise/bleed easily.  Psychiatric/Behavioral:  Negative for depression and suicidal ideas. The patient is not nervous/anxious and does not have insomnia.     Physical Exam: Estimated body mass index is 31.82 kg/m as calculated from the following:   Height as of 10/08/21: 5\' 4"  (1.626 m).   Weight as of 10/08/21: 185 lb 6.4 oz (84.1 kg). There were no vitals taken for this visit. General Appearance: Well nourished, in no apparent distress.  Eyes: PERRLA, EOMs, conjunctiva no swelling or erythema, normal fundi and  vessels.  Sinuses: No Frontal/maxillary tenderness  ENT/Mouth: Ext aud canals clear, normal light reflex with TMs without erythema, bulging. Good dentition. No erythema, swelling, or exudate on post pharynx. Tonsils not swollen or erythematous. Hearing normal.  Neck: Supple, thyroid normal. No bruits  Respiratory: Respiratory effort normal, BS equal bilaterally without rales, rhonchi, wheezing or stridor.  Cardio: RRR without murmurs, rubs or gallops. Brisk peripheral pulses without edema.  Chest: symmetric, with normal excursions and percussion.  Breasts: defer to GYN Abdomen: Soft, nontender, no guarding, rebound,  hernias, masses, or organomegaly.  Lymphatics: Non tender without lymphadenopathy.  Genitourinary: defer to GYN Musculoskeletal: Full ROM all peripheral extremities,5/5 strength, and normal gait.  Skin: Warm, dry without rashes, lesions, ecchymosis. Neuro: Cranial nerves intact, reflexes equal bilaterally. Normal muscle tone, no cerebellar symptoms. Sensation intact.  Psych: Awake and oriented X 3, normal affect, Insight and Judgment appropriate.  Rectal: < 1 cm skin tag noted on stalk  EKG: defer   E  12:26 PM [facility]

## 2022-10-10 ENCOUNTER — Encounter: Payer: Commercial Managed Care - PPO | Admitting: Nurse Practitioner

## 2022-10-10 DIAGNOSIS — Z79899 Other long term (current) drug therapy: Secondary | ICD-10-CM

## 2022-10-10 DIAGNOSIS — Z131 Encounter for screening for diabetes mellitus: Secondary | ICD-10-CM

## 2022-10-10 DIAGNOSIS — E782 Mixed hyperlipidemia: Secondary | ICD-10-CM

## 2022-10-10 DIAGNOSIS — Z0000 Encounter for general adult medical examination without abnormal findings: Secondary | ICD-10-CM

## 2022-10-10 DIAGNOSIS — E559 Vitamin D deficiency, unspecified: Secondary | ICD-10-CM

## 2022-10-10 DIAGNOSIS — E669 Obesity, unspecified: Secondary | ICD-10-CM

## 2022-10-10 DIAGNOSIS — Z130 Encounter for screening for diseases of the blood and blood-forming organs and certain disorders involving the immune mechanism: Secondary | ICD-10-CM

## 2022-10-10 DIAGNOSIS — Z1389 Encounter for screening for other disorder: Secondary | ICD-10-CM

## 2022-10-10 DIAGNOSIS — Z1329 Encounter for screening for other suspected endocrine disorder: Secondary | ICD-10-CM

## 2022-11-20 NOTE — Progress Notes (Unsigned)
Complete Physical  Assessment and Plan: Health Maintenance- Discussed STD testing, safe sex, alcohol and drug awareness, drinking and driving dangers, wearing a seat belt and general safety measures for young adult.  Felicia Bennett was seen today for annual exam.  Diagnoses and all orders for this visit:  Encounter for routine adult health examination without abnormal findings  Due yearly  Had GYN appt this AM  Hyperlipidemia Currently controlling without medication Continue diet and exercise -Lipid panel  Screening for diabetes mellitus -     Hemoglobin A1c  Screening for thyroid disorder -     TSH  Screening, anemia, deficiency, iron -     CBC with Differential/Platelet   Screening for hematuria or proteinuria -     Urinalysis, Routine w reflex microscopic -     Microalbumin / creatinine urine ratio  Medication management -     CBC with Differential/Platelet -     COMPLETE METABOLIC PANEL WITH GFR -     Lipid panel -     TSH -     Hemoglobin A1c -     Urinalysis, Routine w reflex microscopic -     VITAMIN D 25 Hydroxy (Vit-D Deficiency, Fractures) -     Magnesium   Vitamin D deficiency -     VITAMIN D 25 Hydroxy (Vit-D Deficiency, Fractures) - Currently on Vitamin D3 2000 units daily  Obesity Long discussion about weight loss, diet, and exercise Recommended diet heavy in fruits and veggies and low in animal meats, cheeses, and dairy products, appropriate calorie intake Patient will work on decreasing saturated fats, simple carbs and increase exercise Follow up at next visit   Skin tag perianal region Will have evaluated at gynecology for removal  Discussed med's effects and SE's. Screening labs and tests as requested with regular follow-up as recommended. Over 40 minutes of exam, counseling, chart review and critical decision making was performed Future Appointments  Date Time Provider Department Center  11/21/2022  2:00 PM Raynelle Dick, NP [facility] None   11/24/2023  2:00 PM Raynelle Dick, NP [facility] None     HPI  This very nice 22 y.o.female presents for complete physical.  . 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  She has noticed a skin tag at rectum which has been present since she was a child. No bleeding ever, believes it is getting larger, sometimes is irritated.  She is not on cholesterol medication. She has not been watching diet or exercising Lab Results  Component Value Date   CHOL 189 10/08/2021   HDL 63 10/08/2021   LDLCALC 107 (H) 10/08/2021   TRIG 96 10/08/2021   CHOLHDL 3.0 10/08/2021     BMI is There is no height or weight on file to calculate BMI., she has been working on diet and exercise. Wt Readings from Last 3 Encounters:  10/08/21 185 lb 6.4 oz (84.1 kg)  11/01/20 175 lb 3.2 oz (79.5 kg)  09/03/19 175 lb 12.8 oz (79.7 kg)   She is getting good sleep   Finally, patient has history of Vitamin D Deficiency , is currently on 2000 units Vit D3 daily and last vitamin D was  Lab Results  Component Value Date   VD25OH 51 10/08/2021    Current Medications:  Current Outpatient Medications  on File Prior to Visit  Medication Sig Dispense Refill   Cholecalciferol (VITAMIN D3) 50 MCG (2000 UT) CAPS Take by mouth.     MIBELAS 24 FE 1-20 MG-MCG(24) CHEW Chew 1 tablet by mouth daily.  10   No current facility-administered medications on file prior to visit.   Health Maintenance:   Immunization History  Administered Date(s) Administered   DTaP 05/11/1998, 07/10/1998, 09/13/1998, 06/20/1999, 06/24/2003   HIB (PRP-OMP) 05/11/1998, 07/10/1998, 09/13/1998, 06/20/1999   HPV Quadrivalent 10/09/2011, 02/19/2012, 10/19/2012   Hepatitis A 07/25/2008, 07/03/2009   Hepatitis B 05/11/1998, 07/10/1998, 12/13/1998   IPV  05/11/1998, 07/10/1998, 03/13/1999, 06/24/2003   Influenza,inj,Quad PF,6+ Mos 12/12/2015, 01/27/2018   Influenza-Unspecified 12/28/2019   MMR 03/13/1999, 06/24/2003   Meningococcal B, OMV 09/14/2014, 10/19/2014   Meningococcal Conjugate 07/03/2009   Meningococcal polysaccharide vaccine (MPSV4) 09/14/2014   PFIZER(Purple Top)SARS-COV-2 Vaccination 03/18/2019, 04/05/2019   Pfizer Covid-19 Vaccine Bivalent Booster 53yrs & up 01/23/2021   Pneumococcal Conjugate-13 07/10/1998, 09/13/1998, 12/13/1998, 06/20/1999   Td 09/03/2019   Tdap 07/03/2009, 01/23/2021   Varicella 03/13/1999, 12/12/2015    TD/TDAP:09/2019 Influenza: 01/2020 Pneumovax:  Prevnar 13:  HPV vaccines: set of 3 completed 2014  LMP: No LMP recorded. Sexually Active: no STD testing offered Pap: 10/2021 MGM: : N/A  Allergies: No Known Allergies Medical History:  has Obesity (BMI 30.0-34.9); Dysmenorrhea(painful periods); Menorrhagia with regular cycle; mild sx of Fatigue; Inactivity; Stress headaches; Excessive daytime sleepiness; Hx of iron deficiency anemia; Elevated BP without diagnosis of hypertension; Mixed hyperlipidemia; and Vitamin D deficiency on their problem list. Surgical History:  She  has a past surgical history that includes Wisdom tooth extraction. Family History:  Her family history includes ADD / ADHD in her sister; Asthma in her paternal grandmother; Cancer in her paternal grandfather; Cerebral palsy in her brother; Diabetes in her mother and paternal grandmother; Heart attack in her maternal grandfather; Hyperlipidemia in her mother and paternal grandmother; Hypertension in her father, mother, and paternal grandmother; Stroke in her paternal grandmother; Thyroid disease in her maternal grandmother. Social History:   reports that she has never smoked. She has never used smokeless tobacco. She reports that she does not drink alcohol and does not use drugs.  Review of Systems: Review of Systems   Constitutional:  Negative for chills and fever.  HENT:  Negative for congestion, hearing loss, sinus pain, sore throat and tinnitus.   Eyes:  Negative for blurred vision and double vision.  Respiratory:  Negative for cough, hemoptysis, sputum production, shortness of breath and wheezing.   Cardiovascular:  Negative for chest pain, palpitations and leg swelling.  Gastrointestinal:  Negative for abdominal pain, constipation, diarrhea, heartburn, nausea and vomiting.  Genitourinary:  Negative for dysuria and urgency.  Musculoskeletal:  Negative for back pain, falls, joint pain, myalgias and neck pain.  Skin:  Negative for rash.       Skin tag at rectum  Neurological:  Negative for dizziness, tingling, tremors, weakness and headaches.  Endo/Heme/Allergies:  Does not bruise/bleed easily.  Psychiatric/Behavioral:  Negative for depression and suicidal ideas. The patient is not nervous/anxious and does not have insomnia.     Physical Exam: Estimated body mass index is 31.82 kg/m as calculated from the following:   Height as of 10/08/21: 5\' 4"  (1.626 m).   Weight as of 10/08/21: 185 lb 6.4 oz (84.1 kg). There were no vitals taken for this visit. General Appearance: Well nourished, in no apparent distress.  Eyes: PERRLA, EOMs, conjunctiva no swelling or erythema, normal fundi and  vessels.  Sinuses: No Frontal/maxillary tenderness  ENT/Mouth: Ext aud canals clear, normal light reflex with TMs without erythema, bulging. Good dentition. No erythema, swelling, or exudate on post pharynx. Tonsils not swollen or erythematous. Hearing normal.  Neck: Supple, thyroid normal. No bruits  Respiratory: Respiratory effort normal, BS equal bilaterally without rales, rhonchi, wheezing or stridor.  Cardio: RRR without murmurs, rubs or gallops. Brisk peripheral pulses without edema.  Chest: symmetric, with normal excursions and percussion.  Breasts: defer to GYN Abdomen: Soft, nontender, no guarding, rebound,  hernias, masses, or organomegaly.  Lymphatics: Non tender without lymphadenopathy.  Genitourinary: defer to GYN Musculoskeletal: Full ROM all peripheral extremities,5/5 strength, and normal gait.  Skin: Warm, dry without rashes, lesions, ecchymosis. Neuro: Cranial nerves intact, reflexes equal bilaterally. Normal muscle tone, no cerebellar symptoms. Sensation intact.  Psych: Awake and oriented X 3, normal affect, Insight and Judgment appropriate.  Rectal: < 1 cm skin tag noted on stalk  EKG: defer  Kisa Fujii E  12:23 PM [facility]

## 2022-11-21 ENCOUNTER — Encounter: Payer: Self-pay | Admitting: Nurse Practitioner

## 2022-11-21 ENCOUNTER — Ambulatory Visit (INDEPENDENT_AMBULATORY_CARE_PROVIDER_SITE_OTHER): Payer: Commercial Managed Care - PPO | Admitting: Nurse Practitioner

## 2022-11-21 VITALS — BP 112/72 | HR 99 | Temp 97.7°F | Ht 64.0 in | Wt 182.6 lb

## 2022-11-21 DIAGNOSIS — E669 Obesity, unspecified: Secondary | ICD-10-CM

## 2022-11-21 DIAGNOSIS — Z79899 Other long term (current) drug therapy: Secondary | ICD-10-CM

## 2022-11-21 DIAGNOSIS — R7309 Other abnormal glucose: Secondary | ICD-10-CM

## 2022-11-21 DIAGNOSIS — Z1389 Encounter for screening for other disorder: Secondary | ICD-10-CM

## 2022-11-21 DIAGNOSIS — Z23 Encounter for immunization: Secondary | ICD-10-CM | POA: Diagnosis not present

## 2022-11-21 DIAGNOSIS — Z130 Encounter for screening for diseases of the blood and blood-forming organs and certain disorders involving the immune mechanism: Secondary | ICD-10-CM

## 2022-11-21 DIAGNOSIS — Z0001 Encounter for general adult medical examination with abnormal findings: Secondary | ICD-10-CM

## 2022-11-21 DIAGNOSIS — Z1329 Encounter for screening for other suspected endocrine disorder: Secondary | ICD-10-CM

## 2022-11-21 DIAGNOSIS — Z131 Encounter for screening for diabetes mellitus: Secondary | ICD-10-CM

## 2022-11-21 DIAGNOSIS — Z0000 Encounter for general adult medical examination without abnormal findings: Secondary | ICD-10-CM

## 2022-11-21 DIAGNOSIS — R519 Headache, unspecified: Secondary | ICD-10-CM

## 2022-11-21 DIAGNOSIS — E782 Mixed hyperlipidemia: Secondary | ICD-10-CM

## 2022-11-21 DIAGNOSIS — E559 Vitamin D deficiency, unspecified: Secondary | ICD-10-CM

## 2022-11-21 MED ORDER — PHENTERMINE HCL 37.5 MG PO TABS
ORAL_TABLET | ORAL | 2 refills | Status: AC
Start: 2022-11-21 — End: ?

## 2022-11-21 NOTE — Patient Instructions (Signed)
Fair life protein shakes Eat more frequently - try not to go more than 6 hours without protein Aim for 90 grams of protein a day- 30 breakfast/30 lunch 30 dinner Try to keep net carbs less than 50 Net Carbs=Total Carbs-fiber- sugar alcohols Exercise heartrate 120-140(fat burning zone)- walking 20-30 minutes 4 days a week Phentermine  37.5 mg 1 tab daily

## 2022-11-22 LAB — CBC WITH DIFFERENTIAL/PLATELET
Absolute Monocytes: 579 cells/uL (ref 200–950)
Basophils Absolute: 27 cells/uL (ref 0–200)
Basophils Relative: 0.3 %
Eosinophils Absolute: 53 cells/uL (ref 15–500)
Eosinophils Relative: 0.6 %
HCT: 43.8 % (ref 35.0–45.0)
Hemoglobin: 14.6 g/dL (ref 11.7–15.5)
Lymphs Abs: 1789 cells/uL (ref 850–3900)
MCH: 28.8 pg (ref 27.0–33.0)
MCHC: 33.3 g/dL (ref 32.0–36.0)
MCV: 86.4 fL (ref 80.0–100.0)
MPV: 11.3 fL (ref 7.5–12.5)
Monocytes Relative: 6.5 %
Neutro Abs: 6453 cells/uL (ref 1500–7800)
Neutrophils Relative %: 72.5 %
Platelets: 295 10*3/uL (ref 140–400)
RBC: 5.07 10*6/uL (ref 3.80–5.10)
RDW: 13.1 % (ref 11.0–15.0)
Total Lymphocyte: 20.1 %
WBC: 8.9 10*3/uL (ref 3.8–10.8)

## 2022-11-22 LAB — COMPLETE METABOLIC PANEL WITH GFR
AG Ratio: 1.3 (calc) (ref 1.0–2.5)
ALT: 13 U/L (ref 6–29)
AST: 13 U/L (ref 10–30)
Albumin: 4.4 g/dL (ref 3.6–5.1)
Alkaline phosphatase (APISO): 83 U/L (ref 31–125)
BUN: 13 mg/dL (ref 7–25)
CO2: 23 mmol/L (ref 20–32)
Calcium: 9.6 mg/dL (ref 8.6–10.2)
Chloride: 106 mmol/L (ref 98–110)
Creat: 0.81 mg/dL (ref 0.50–0.96)
Globulin: 3.4 g/dL (calc) (ref 1.9–3.7)
Glucose, Bld: 89 mg/dL (ref 65–99)
Potassium: 4.2 mmol/L (ref 3.5–5.3)
Sodium: 139 mmol/L (ref 135–146)
Total Bilirubin: 0.4 mg/dL (ref 0.2–1.2)
Total Protein: 7.8 g/dL (ref 6.1–8.1)
eGFR: 104 mL/min/{1.73_m2} (ref >=60)

## 2022-11-22 LAB — URINALYSIS, ROUTINE W REFLEX MICROSCOPIC
Bilirubin Urine: NEGATIVE
Glucose, UA: NEGATIVE
Hgb urine dipstick: NEGATIVE
Ketones, ur: NEGATIVE
Leukocytes,Ua: NEGATIVE
Nitrite: NEGATIVE
Protein, ur: NEGATIVE
Specific Gravity, Urine: 1.021 (ref 1.001–1.035)
pH: 5.5 (ref 5.0–8.0)

## 2022-11-22 LAB — VITAMIN D 25 HYDROXY (VIT D DEFICIENCY, FRACTURES): Vit D, 25-Hydroxy: 40 ng/mL (ref 30–100)

## 2022-11-22 LAB — LIPID PANEL
Cholesterol: 205 mg/dL — ABNORMAL HIGH (ref <200)
HDL: 61 mg/dL (ref >=50)
LDL Cholesterol (Calc): 118 mg/dL (calc) — ABNORMAL HIGH
Non-HDL Cholesterol (Calc): 144 mg/dL (calc) — ABNORMAL HIGH (ref <130)
Total CHOL/HDL Ratio: 3.4 (calc) (ref <5.0)
Triglycerides: 149 mg/dL (ref <150)

## 2022-11-22 LAB — TSH: TSH: 1.21 mIU/L

## 2022-11-22 LAB — MAGNESIUM: Magnesium: 2.2 mg/dL (ref 1.5–2.5)

## 2022-11-22 LAB — HEMOGLOBIN A1C W/OUT EAG: Hgb A1c MFr Bld: 5.7 % of total Hgb — ABNORMAL HIGH (ref <5.7)

## 2023-01-04 ENCOUNTER — Ambulatory Visit
Admission: EM | Admit: 2023-01-04 | Discharge: 2023-01-04 | Disposition: A | Discharge status: Home / Self Care | Payer: Commercial Managed Care - PPO | Attending: Emergency Medicine | Admitting: Emergency Medicine

## 2023-01-04 DIAGNOSIS — J0100 Acute maxillary sinusitis, unspecified: Secondary | ICD-10-CM

## 2023-01-04 LAB — POC COVID19/FLU A&B COMBO
Covid Antigen, POC: NEGATIVE
Influenza A Antigen, POC: NEGATIVE
Influenza B Antigen, POC: NEGATIVE

## 2023-01-04 LAB — POCT RAPID STREP A (OFFICE): Rapid Strep A Screen: NEGATIVE

## 2023-01-04 MED ORDER — AMOXICILLIN 875 MG PO TABS: 875.0000 mg | ORAL_TABLET | Freq: Two times a day (BID) | ORAL | 0 refills | Status: AC

## 2023-01-04 NOTE — ED Provider Notes (Signed)
Renaldo Fiddler    CSN: [id number] Arrival date & time: 01/04/23  1334      History   Chief Complaint Chief Complaint  Patient presents with   URI    HPI Felicia Bennett is a 22 y.o. female.  Patient presents with 6-day history of bodyaches, congestion, sinus pressure, sinus drainage, sore throat, headache.  Treating symptoms with ibuprofen and Flonase nasal spray.  No fever, cough, shortness of breath, or other symptoms.  The history is provided by the patient and medical records.    Past Medical History:  Diagnosis Date   Obese 08/30/2015    Patient Active Problem List   Diagnosis Date Noted   Mixed hyperlipidemia 10/08/2021   Vitamin D deficiency 10/08/2021   Elevated BP without diagnosis of hypertension 09/03/2019   Excessive daytime sleepiness 01/27/2018   Hx of iron deficiency anemia 01/27/2018   Stress headaches 11/24/2015   Inactivity 10/05/2015   Obesity (BMI 30.0-34.9) 08/30/2015   Dysmenorrhea(painful periods) 08/30/2015   Menorrhagia with regular cycle 08/30/2015   mild sx of Fatigue 08/30/2015    Past Surgical History:  Procedure Laterality Date   WISDOM TOOTH EXTRACTION      OB History   No obstetric history on file.      Home Medications    Prior to Admission medications   Medication Sig Start Date End Date Taking? Authorizing Provider  amoxicillin (AMOXIL) 875 MG tablet Take 1 tablet (875 mg total) by mouth 2 (two) times daily for 10 days. 01/04/23 01/14/23 Yes Mickie Bail, NP  Cholecalciferol (VITAMIN D3) 50 MCG (2000 UT) CAPS Take by mouth. Patient not taking: Reported on 11/21/2022    [provider]  MIBELAS 24 FE 1-20 MG-MCG(24) CHEW Chew 1 tablet by mouth daily. 08/28/15   [provider]  phentermine (ADIPEX-P) 37.5 MG tablet Take 1/2 to 1 tablet every morning for dieting & weightloss 11/21/22   Raynelle Dick, NP    Family History Family History  Problem Relation Age of Onset   Hyperlipidemia Mother     Hypertension Mother    Diabetes Mother    Hypertension Father    ADD / ADHD Sister    Cerebral palsy Brother    Thyroid disease Maternal Grandmother    Heart attack Maternal Grandfather    Asthma Paternal Grandmother    Hypertension Paternal Grandmother    Hyperlipidemia Paternal Grandmother    Diabetes Paternal Grandmother    Stroke Paternal Grandmother    Cancer Paternal Grandfather        prostate    Social History Social History   Tobacco Use   Smoking status: Never   Smokeless tobacco: Never  Vaping Use   Vaping status: Never Used  Substance Use Topics   Alcohol use: Never   Drug use: No     Allergies   Patient has no known allergies.   Review of Systems Review of Systems  Constitutional:  Negative for chills and fever.  HENT:  Positive for congestion, postnasal drip, rhinorrhea, sinus pressure and sore throat. Negative for ear pain.   Respiratory:  Negative for cough and shortness of breath.   Neurological:  Positive for headaches.     Physical Exam Triage Vital Signs ED Triage Vitals [01/04/23 1356]  Encounter Vitals Group     BP (!) 126/90     Systolic BP Percentile      Diastolic BP Percentile      Pulse Rate (!) 108     Resp 18  Temp 99.6 F (37.6 C)     Temp src      SpO2 98 %     Weight      Height      Head Circumference      Peak Flow      Pain Score      Pain Loc      Pain Education      Exclude from Growth Chart    No data found.  Updated Vital Signs BP (!) 126/90   Pulse (!) 108   Temp 99.6 F (37.6 C)   Resp 18   SpO2 98%   Visual Acuity Right Eye Distance:   Left Eye Distance:   Bilateral Distance:    Right Eye Near:   Left Eye Near:    Bilateral Near:     Physical Exam Constitutional:      General: She is not in acute distress. HENT:     Right Ear: Tympanic membrane normal.     Left Ear: Tympanic membrane normal.     Nose: Congestion present.     Mouth/Throat:     Mouth: Mucous membranes are moist.      Pharynx: Oropharynx is clear.  Cardiovascular:     Rate and Rhythm: Normal rate and regular rhythm.     Heart sounds: Normal heart sounds.  Pulmonary:     Effort: Pulmonary effort is normal. No respiratory distress.     Breath sounds: Normal breath sounds.  Skin:    General: Skin is warm and dry.  Neurological:     Mental Status: She is alert.      UC Treatments / Results  Labs (all labs ordered are listed, but only abnormal results are displayed) Labs Reviewed  POCT RAPID STREP A (OFFICE)  POC COVID19/FLU A&B COMBO    EKG   Radiology No results found.  Procedures Procedures (including critical care time)  Medications Ordered in UC Medications - No data to display  Initial Impression / Assessment and Plan / UC Course  I have reviewed the triage vital signs and the nursing notes.  Pertinent labs & imaging results that were available during my care of the patient were reviewed by me and considered in my medical decision making (see chart for details).   Acute sinusitis.  Patient has been symptomatic for 6 days and is not improving with OTC treatment.  Treating today with amoxicillin x 10 days for sinus infection.  Tylenol or ibuprofen as needed.  Plain Mucinex as needed.  Instructed patient to follow-up with her PCP if she is not improving.  She agrees to plan of care.  Final Clinical Impressions(s) / UC Diagnoses   Final diagnoses:  Acute non-recurrent maxillary sinusitis     Discharge Instructions      Your strep, flu, COVID tests are negative.    Take the amoxicillin as directed for sinus infection.  Follow-up with your primary care provider if your symptoms are not improving.      ED Prescriptions     Medication Sig Dispense Auth. Provider   amoxicillin (AMOXIL) 875 MG tablet Take 1 tablet (875 mg total) by mouth 2 (two) times daily for 10 days. 20 tablet Mickie Bail, NP      PDMP not reviewed this encounter.   Mickie Bail, NP 01/04/23  1432

## 2023-01-04 NOTE — ED Triage Notes (Signed)
Patient to Urgent Care with complaints of nasal congestion/ headaches/ generalized body aches/ sore throat. Upset stomach but denies NVD.  Symptoms started on Monday. Denies any known fevers. Has been taking ibuprofen/ using Flonase.

## 2023-01-04 NOTE — Discharge Instructions (Signed)
Your strep, flu, COVID tests are negative.    Take the amoxicillin as directed for sinus infection.  Follow-up with your primary care provider if your symptoms are not improving.

## 2023-02-21 ENCOUNTER — Ambulatory Visit: Payer: Commercial Managed Care - PPO | Admitting: Nurse Practitioner

## 2023-02-27 NOTE — Progress Notes (Signed)
3 month follow up  Assessment and Plan:  Hyperlipidemia Currently controlling without medication Continue diet and exercise -Lipid panel  Abnormal Glucose Continue diet and exercise - CBC - CMP   Latent TB Completed course of Rifampin   Generalized headaches Continue to hydrate well, use Excedrin as needed.  Vitamin D deficiency -     VITAMIN D 25 Hydroxy (Vit-D Deficiency, Fractures) - Normally takes Vit D 2000 units daily but has been out for 1 month  Obesity- BMI 30 Long discussion about weight loss, diet, and exercise Recommended diet heavy in fruits and veggies and low in animal meats, cheeses, and dairy products, appropriate calorie intake Patient will work on decreasing saturated fats, simple carbs and increase exercise Plans to restart Phentermine- has a prescription at home to start Follow up at next visit  - TSH   Medication management -     CBC with Differential/Platelet -     COMPLETE METABOLIC PANEL WITH GFR -     Lipid panel -     TSH      Discussed med's effects and SE's. Screening labs and tests as requested with regular follow-up as recommended. Over 40 minutes of exam, counseling, chart review and critical decision making was performed Future Appointments  Date Time Provider Department Center  11/24/2023  2:00 PM Raynelle Dick, NP [facility] None     HPI  This very nice 22 y.o.female presents for complete physical. 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  Took Rifampin 600 mg for 4 months. She has completed her antibiotic course.   BP well controlled without medication BP Readings from Last 3 Encounters:  02/28/23 110/78  01/04/23 (!) 126/90  11/21/22 112/72  She does workout. She is not currently exercising , in full time Masters program to become a PA. She is graduating in May. Pt has only 3 rotations  left.   She had been experiencing more headaches in the past year, was determined she was clenching her jaw a lot . Is wearing a mouth guard and headaches have improved.   She is not on cholesterol medication. She has been limiting red meat, fried foods , eggs and some dairy. She is using Benefiber.  Lab Results  Component Value Date   CHOL 205 (H) 11/21/2022   HDL 61 11/21/2022   LDLCALC 118 (H) 11/21/2022   TRIG 149 11/21/2022   CHOLHDL 3.4 11/21/2022     BMI is Body mass index is 30.11 kg/m., she has been working on diet and exercise. She has been walking everyday for 20-30 minutes. She is working on portion control. She has used Phentermine in the past with success. Not currently on Phentermine but plans to restart.  Wt Readings from Last 3 Encounters:  02/28/23 175 lb 6.4 oz (79.6 kg)  11/21/22 182 lb 9.6 oz (82.8 kg)  10/08/21 185 lb 6.4 oz (84.1 kg)    Finally, patient has history of Vitamin D Deficiency , is currently on 14-Sep-1998 units Vit D3 daily and last vitamin D was  Lab Results  Component Value Date   VD25OH 40 11/21/2022    Current Medications:  Current Outpatient Medications on File Prior to Visit  Medication Sig Dispense Refill   Cholecalciferol (VITAMIN D3) 50 MCG (2000 UT) CAPS Take 5,000 Units by mouth.     MIBELAS 24 FE 1-20 MG-MCG(24) CHEW  Chew 1 tablet by mouth daily.  10   phentermine (ADIPEX-P) 37.5 MG tablet Take 1/2 to 1 tablet every morning for dieting & weightloss (Patient not taking: Reported on 02/28/2023) 30 tablet 2   No current facility-administered medications on file prior to visit.   Health Maintenance:   Immunization History  Administered Date(s) Administered   DTaP 05/11/1998, 07/10/1998, 09/13/1998, 06/20/1999, 06/24/2003   Fluzone Influenza virus vaccine,trivalent (IIV3), split virus 11/21/2022   HIB (PRP-OMP) 05/11/1998, 07/10/1998, 09/13/1998, 06/20/1999   HPV Quadrivalent 10/09/2011, 02/19/2012, 10/19/2012   Hepatitis A 07/25/2008,  07/03/2009   Hepatitis B 05/11/1998, 07/10/1998, 12/13/1998   IPV 05/11/1998, 07/10/1998, 03/13/1999, 06/24/2003   Influenza,inj,Quad PF,6+ Mos 12/12/2015, 01/27/2018   Influenza-Unspecified 12/28/2019   MMR 03/13/1999, 06/24/2003   Meningococcal B, OMV 09/14/2014, 10/19/2014   Meningococcal Conjugate 07/03/2009   Meningococcal polysaccharide vaccine (MPSV4) 09/14/2014   PFIZER(Purple Top)SARS-COV-2 Vaccination 03/18/2019, 04/05/2019   Pfizer Covid-19 Vaccine Bivalent Booster 12yrs & up 01/23/2021   Pneumococcal Conjugate-13 07/10/1998, 09/13/1998, 12/13/1998, 06/20/1999   Td 09/03/2019   Tdap 07/03/2009, 01/23/2021   Varicella 03/13/1999, 12/12/2015    Health Maintenance  Topic Date Due   HIV Screening  Never done   Cervical Cancer Screening (Pap smear)  Never done   COVID-19 Vaccine (4 - 2024-25 season) 11/03/2022   DTaP/Tdap/Td (9 - Td or Tdap) 01/24/2031   INFLUENZA VACCINE  Completed   HPV VACCINES  Completed   Hepatitis C Screening  Discontinued     Allergies: No Known Allergies Medical History:  has Obesity (BMI 30.0-34.9); Dysmenorrhea(painful periods); Menorrhagia with regular cycle; mild sx of Fatigue; Inactivity; Stress headaches; Excessive daytime sleepiness; Hx of iron deficiency anemia; Elevated BP without diagnosis of hypertension; Mixed hyperlipidemia; and Vitamin D deficiency on their problem list. Surgical History:  She  has a past surgical history that includes Wisdom tooth extraction. Family History:  Her family history includes ADD / ADHD in her sister; Asthma in her paternal grandmother; Cancer in her paternal grandfather; Cerebral palsy in her brother; Diabetes in her mother and paternal grandmother; Heart attack in her maternal grandfather; Hyperlipidemia in her mother and paternal grandmother; Hypertension in her father, mother, and paternal grandmother; Stroke in her paternal grandmother; Thyroid disease in her maternal grandmother. Social History:    reports that she has never smoked. She has never used smokeless tobacco. She reports that she does not drink alcohol and does not use drugs.  Review of Systems: Review of Systems  Constitutional:  Negative for chills and fever.  HENT:  Negative for congestion, hearing loss, sinus pain, sore throat and tinnitus.   Eyes:  Negative for blurred vision and double vision.  Respiratory:  Negative for cough, hemoptysis, sputum production, shortness of breath and wheezing.   Cardiovascular:  Negative for chest pain, palpitations and leg swelling.  Gastrointestinal:  Negative for abdominal pain, constipation, diarrhea, heartburn, nausea and vomiting.  Genitourinary:  Negative for dysuria and urgency.  Musculoskeletal:  Negative for back pain, falls, joint pain, myalgias and neck pain.  Skin:  Negative for rash.  Neurological:  Positive for headaches (intermittent). Negative for dizziness, tingling, tremors and weakness.  Endo/Heme/Allergies:  Does not bruise/bleed easily.  Psychiatric/Behavioral:  Negative for depression and suicidal ideas. The patient is not nervous/anxious and does not have insomnia.     Physical Exam: Estimated body mass index is 30.11 kg/m as calculated from the following:   Height as of this encounter: 5\' 4"  (1.626 m).   Weight as of this encounter: 175 lb 6.4  oz (79.6 kg). BP 110/78   Pulse 93   Temp 98.2 F (36.8 C)   Ht 5\' 4"  (1.626 m)   Wt 175 lb 6.4 oz (79.6 kg)   SpO2 99%   BMI 30.11 kg/m  General Appearance: Well nourished, in no apparent distress.  Eyes: PERRLA, EOMs, conjunctiva no swelling or erythema Sinuses: No Frontal/maxillary tenderness  ENT/Mouth: Ext aud canals clear, normal light reflex with TMs without erythema, bulging. Good dentition. No erythema, swelling, or exudate on post pharynx. Hearing normal.  Neck: Supple, thyroid normal. No bruits  Respiratory: Respiratory effort normal, BS equal bilaterally without rales, rhonchi, wheezing or stridor.   Cardio: RRR without murmurs, rubs or gallops. Brisk peripheral pulses without edema.  Chest: symmetric, with normal excursions and percussion.  Breasts: defer to GYN Abdomen: Soft, nontender, no guarding, rebound, hernias, masses, or organomegaly.  Lymphatics: Non tender without lymphadenopathy.  Genitourinary: defer to GYN Musculoskeletal: Full ROM all peripheral extremities,5/5 strength, and normal gait.  Skin: Warm, dry without rashes, lesions, ecchymosis. Neuro: Cranial nerves intact, reflexes equal bilaterally. Normal muscle tone, no cerebellar symptoms. Sensation intact.  Psych: Awake and oriented X 3, normal affect, Insight and Judgment appropriate.      Gurjit Loconte E  10:40 AM [facility]

## 2023-02-28 ENCOUNTER — Ambulatory Visit (INDEPENDENT_AMBULATORY_CARE_PROVIDER_SITE_OTHER): Payer: Commercial Managed Care - PPO | Admitting: Nurse Practitioner

## 2023-02-28 ENCOUNTER — Encounter: Payer: Self-pay | Admitting: Nurse Practitioner

## 2023-02-28 VITALS — BP 110/78 | HR 93 | Temp 98.2°F | Ht 64.0 in | Wt 175.4 lb

## 2023-02-28 DIAGNOSIS — E782 Mixed hyperlipidemia: Secondary | ICD-10-CM | POA: Diagnosis not present

## 2023-02-28 DIAGNOSIS — E66811 Obesity, class 1: Secondary | ICD-10-CM | POA: Diagnosis not present

## 2023-02-28 DIAGNOSIS — E559 Vitamin D deficiency, unspecified: Secondary | ICD-10-CM | POA: Diagnosis not present

## 2023-02-28 DIAGNOSIS — Z227 Latent tuberculosis: Secondary | ICD-10-CM

## 2023-02-28 DIAGNOSIS — Z79899 Other long term (current) drug therapy: Secondary | ICD-10-CM

## 2023-02-28 DIAGNOSIS — R519 Headache, unspecified: Secondary | ICD-10-CM

## 2023-02-28 DIAGNOSIS — R7309 Other abnormal glucose: Secondary | ICD-10-CM

## 2023-02-28 NOTE — Patient Instructions (Signed)
Fair life protein shakes Eat more frequently - try not to go more than 6 hours without protein Aim for 90 grams of protein a day- 30 breakfast/30 lunch 30 dinner Try to keep net carbs less than 50 Net Carbs=Total Carbs-fiber- sugar alcohols Exercise heartrate 120-140(fat burning zone)- walking 20-30 minutes 4 days a week  

## 2023-03-01 LAB — COMPLETE METABOLIC PANEL WITH GFR
AG Ratio: 1.3 (calc) (ref 1.0–2.5)
ALT: 20 U/L (ref 6–29)
AST: 19 U/L (ref 10–30)
Albumin: 4.5 g/dL (ref 3.6–5.1)
Alkaline phosphatase (APISO): 87 U/L (ref 31–125)
BUN: 14 mg/dL (ref 7–25)
CO2: 24 mmol/L (ref 20–32)
Calcium: 9.5 mg/dL (ref 8.6–10.2)
Chloride: 105 mmol/L (ref 98–110)
Creat: 0.75 mg/dL (ref 0.50–0.96)
Globulin: 3.4 g/dL (ref 1.9–3.7)
Glucose, Bld: 87 mg/dL (ref 65–99)
Potassium: 4.5 mmol/L (ref 3.5–5.3)
Sodium: 139 mmol/L (ref 135–146)
Total Bilirubin: 0.4 mg/dL (ref 0.2–1.2)
Total Protein: 7.9 g/dL (ref 6.1–8.1)
eGFR: 114 mL/min/{1.73_m2} (ref >=60)

## 2023-03-01 LAB — CBC WITH DIFFERENTIAL/PLATELET
Absolute Lymphocytes: 1378 {cells}/uL (ref 850–3900)
Absolute Monocytes: 353 {cells}/uL (ref 200–950)
Basophils Absolute: 39 {cells}/uL (ref 0–200)
Basophils Relative: 0.7 %
Eosinophils Absolute: 118 {cells}/uL (ref 15–500)
Eosinophils Relative: 2.1 %
HCT: 46.4 % — ABNORMAL HIGH (ref 35.0–45.0)
Hemoglobin: 15.4 g/dL (ref 11.7–15.5)
MCH: 28.6 pg (ref 27.0–33.0)
MCHC: 33.2 g/dL (ref 32.0–36.0)
MCV: 86.1 fL (ref 80.0–100.0)
MPV: 11.6 fL (ref 7.5–12.5)
Monocytes Relative: 6.3 %
Neutro Abs: 3713 {cells}/uL (ref 1500–7800)
Neutrophils Relative %: 66.3 %
Platelets: 271 10*3/uL (ref 140–400)
RBC: 5.39 10*6/uL — ABNORMAL HIGH (ref 3.80–5.10)
RDW: 12.7 % (ref 11.0–15.0)
Total Lymphocyte: 24.6 %
WBC: 5.6 10*3/uL (ref 3.8–10.8)

## 2023-03-01 LAB — LIPID PANEL
Cholesterol: 230 mg/dL — ABNORMAL HIGH (ref <200)
HDL: 66 mg/dL (ref >=50)
LDL Cholesterol (Calc): 143 mg/dL — ABNORMAL HIGH
Non-HDL Cholesterol (Calc): 164 mg/dL — ABNORMAL HIGH (ref <130)
Total CHOL/HDL Ratio: 3.5 (calc) (ref <5.0)
Triglycerides: 98 mg/dL (ref <150)

## 2023-03-01 LAB — TSH: TSH: 1.58 m[IU]/L

## 2023-03-02 ENCOUNTER — Other Ambulatory Visit: Payer: Self-pay | Admitting: Nurse Practitioner

## 2023-03-02 DIAGNOSIS — D751 Secondary polycythemia: Secondary | ICD-10-CM

## 2023-03-03 ENCOUNTER — Telehealth: Payer: Self-pay | Admitting: Nurse Practitioner

## 2023-03-03 NOTE — Telephone Encounter (Signed)
Called patient to schedule a 2 week follow up to recheck CBC. She is in [place] for the next  6 weeks. She is wondering if she could get the labs done somewhere in [place] or could she wait until she is back in town?

## 2023-03-03 NOTE — Telephone Encounter (Signed)
I called patient to tell her it is okay to recheck CBC in 6 weeks instead of 2 weeks. She wanted to call back when it gets closer to the 6 week mark, so she knows her schedule.

## 2023-03-03 NOTE — Telephone Encounter (Signed)
It will be fine to recheck in 6 weeks

## 2023-09-17 ENCOUNTER — Ambulatory Visit: Payer: Commercial Managed Care - PPO | Admitting: Family Medicine

## 2023-10-15 ENCOUNTER — Encounter: Payer: Commercial Managed Care - PPO | Admitting: Nurse Practitioner

## 2023-11-24 ENCOUNTER — Encounter: Payer: Commercial Managed Care - PPO | Admitting: Nurse Practitioner
